# Patient Record
Sex: Female | Born: 1950 | Race: White | Hispanic: No | Marital: Married | Smoking: Never smoker
Health system: Southern US, Community
[De-identification: ages and names within clinical notes are randomized; demographics above are authoritative.]

## PROBLEM LIST (undated history)

## (undated) DIAGNOSIS — K8020 Calculus of gallbladder without cholecystitis without obstruction: Secondary | ICD-10-CM

## (undated) DIAGNOSIS — E079 Disorder of thyroid, unspecified: Secondary | ICD-10-CM

## (undated) DIAGNOSIS — K5790 Diverticulosis of intestine, part unspecified, without perforation or abscess without bleeding: Secondary | ICD-10-CM

## (undated) DIAGNOSIS — N281 Cyst of kidney, acquired: Secondary | ICD-10-CM

## (undated) DIAGNOSIS — I10 Essential (primary) hypertension: Secondary | ICD-10-CM

## (undated) DIAGNOSIS — K449 Diaphragmatic hernia without obstruction or gangrene: Secondary | ICD-10-CM

## (undated) HISTORY — PX: CHOLECYSTECTOMY: SHX55

## (undated) HISTORY — PX: TUBAL LIGATION: SHX77

## (undated) HISTORY — DX: Calculus of gallbladder without cholecystitis without obstruction: K80.20

## (undated) HISTORY — PX: ABDOMINAL HYSTERECTOMY: SHX81

## (undated) HISTORY — DX: Cyst of kidney, acquired: N28.1

---

## 2016-06-05 ENCOUNTER — Encounter (HOSPITAL_COMMUNITY): Payer: Self-pay | Admitting: Cardiology

## 2016-06-05 ENCOUNTER — Emergency Department (HOSPITAL_COMMUNITY): Payer: Medicare Other

## 2016-06-05 ENCOUNTER — Emergency Department (HOSPITAL_COMMUNITY)
Admission: EM | Admit: 2016-06-05 | Discharge: 2016-06-05 | Disposition: A | Discharge status: Home / Self Care | Payer: Medicare Other | Attending: Emergency Medicine | Admitting: Emergency Medicine

## 2016-06-05 DIAGNOSIS — Z79899 Other long term (current) drug therapy: Secondary | ICD-10-CM | POA: Diagnosis not present

## 2016-06-05 DIAGNOSIS — K909 Intestinal malabsorption, unspecified: Secondary | ICD-10-CM | POA: Diagnosis not present

## 2016-06-05 DIAGNOSIS — I10 Essential (primary) hypertension: Secondary | ICD-10-CM | POA: Diagnosis not present

## 2016-06-05 DIAGNOSIS — R1084 Generalized abdominal pain: Secondary | ICD-10-CM

## 2016-06-05 DIAGNOSIS — R197 Diarrhea, unspecified: Secondary | ICD-10-CM | POA: Diagnosis present

## 2016-06-05 HISTORY — DX: Diverticulosis of intestine, part unspecified, without perforation or abscess without bleeding: K57.90

## 2016-06-05 HISTORY — DX: Calculus of gallbladder without cholecystitis without obstruction: K80.20

## 2016-06-05 HISTORY — DX: Disorder of thyroid, unspecified: E07.9

## 2016-06-05 HISTORY — DX: Essential (primary) hypertension: I10

## 2016-06-05 HISTORY — DX: Diaphragmatic hernia without obstruction or gangrene: K44.9

## 2016-06-05 LAB — URINALYSIS, ROUTINE W REFLEX MICROSCOPIC
Bilirubin Urine: NEGATIVE
GLUCOSE, UA: NEGATIVE mg/dL
HGB URINE DIPSTICK: NEGATIVE
KETONES UR: NEGATIVE mg/dL
Nitrite: NEGATIVE
PROTEIN: NEGATIVE mg/dL
Specific Gravity, Urine: 1.021 (ref 1.005–1.030)
pH: 5 (ref 5.0–8.0)

## 2016-06-05 LAB — COMPREHENSIVE METABOLIC PANEL
ALK PHOS: 77 U/L (ref 38–126)
ALT: 17 U/L (ref 14–54)
ANION GAP: 9 (ref 5–15)
AST: 21 U/L (ref 15–41)
Albumin: 4.1 g/dL (ref 3.5–5.0)
BUN: 10 mg/dL (ref 6–20)
CALCIUM: 9.5 mg/dL (ref 8.9–10.3)
CHLORIDE: 104 mmol/L (ref 101–111)
CO2: 25 mmol/L (ref 22–32)
Creatinine, Ser: 1.01 mg/dL — ABNORMAL HIGH (ref 0.44–1.00)
GFR calc non Af Amer: 57 mL/min — ABNORMAL LOW (ref >60)
Glucose, Bld: 114 mg/dL — ABNORMAL HIGH (ref 65–99)
POTASSIUM: 4.1 mmol/L (ref 3.5–5.1)
SODIUM: 138 mmol/L (ref 135–145)
Total Bilirubin: 0.8 mg/dL (ref 0.3–1.2)
Total Protein: 7.6 g/dL (ref 6.5–8.1)

## 2016-06-05 LAB — CBC
HCT: 41.9 % (ref 36.0–46.0)
HEMOGLOBIN: 14.2 g/dL (ref 12.0–15.0)
MCH: 32.4 pg (ref 26.0–34.0)
MCHC: 33.9 g/dL (ref 30.0–36.0)
MCV: 95.7 fL (ref 78.0–100.0)
Platelets: 291 10*3/uL (ref 150–400)
RBC: 4.38 MIL/uL (ref 3.87–5.11)
RDW: 14 % (ref 11.5–15.5)
WBC: 10.5 10*3/uL (ref 4.0–10.5)

## 2016-06-05 LAB — LIPASE, BLOOD: LIPASE: 20 U/L (ref 11–51)

## 2016-06-05 MED ORDER — DICYCLOMINE HCL 20 MG PO TABS: 20.0000 mg | ORAL_TABLET | Freq: Two times a day (BID) | ORAL | 0 refills | Status: DC

## 2016-06-05 MED ORDER — ONDANSETRON 4 MG PO TBDP: 4.0000 mg | ORAL_TABLET | Freq: Three times a day (TID) | ORAL | 0 refills | Status: DC | PRN

## 2016-06-05 MED ORDER — KETOROLAC TROMETHAMINE 30 MG/ML IJ SOLN
30.0000 mg | Freq: Once | INTRAMUSCULAR | Status: AC
  Administered 2016-06-05: 30 mg via INTRAVENOUS
  Filled 2016-06-05: qty 1

## 2016-06-05 NOTE — Discharge Instructions (Signed)
Dicyclomine 4 times daily as needed Zofran for nausea Your testing shows that you have a kidney spot that needs further evaluation with an MRI Share these results with your doctor and have them order the further testing for the next 2 weeks ER for severe or worsening symptoms.

## 2016-06-05 NOTE — ED Provider Notes (Signed)
[facility]Y DEPT Provider Note   CSN: [id number] Arrival date & time: 06/05/16  1028  By signing my name below, I, Denise Mcguire, attest that this documentation has been prepared under the direction and in the presence of Eber Hong, MD. Electronically Signed: Marnette Burgess Mcguire, Scribe. 06/05/2016. 12:02 PM.  History   Chief Complaint Chief Complaint  Patient presents with  . Abdominal Pain  . Emesis   The history is provided by the patient and medical records. No language interpreter was used.   HPI Comments:  Denise Mcguire is a 66 y.o. female with a PMHx of Cholelithiasis, HTN, Hiatal Hernia, Thyroid Disease, and Diverticulosis, who presents to the Emergency Department complaining of intermittent, radiating, 8/10, upper abdominal pain onset two weeks ago. Pt reports these episodes of upper abdominal pain arising two weeks ago being persistent since that time. She states the pain is intermittent and radiates down into her suprapubic abdomen. When present, the pain remains for 10 minutes before subsiding. Yesterday PTA, the pt had associated symptoms of vomiting and diarrhea x4 qualified as watery. No h/o COPD, Asthma, UC, IBS, CA, Pancreatitis, or any significant coronary events. No recent travel or sick contact with similar symptoms. No alleviating factors noted and eating does not exacerbate her pain. Pt has a PSHx of hysterectomy for bladder drop. Pt denies urgency, frequency, hematuria, dysuria, difficulty urinating, back pain, any current pain, alongside any other complaints at this time.  Past Medical History:  Diagnosis Date  . Diverticulosis   . Gallstones   . Hiatal hernia   . Hypertension   . Thyroid disease    There are no active problems to display for this patient.  Past Surgical History:  Procedure Laterality Date  . ABDOMINAL HYSTERECTOMY     OB History    No data available     Home Medications    Prior to Admission medications   Medication Sig Start Date  End Date Taking? Authorizing Provider  ALPRAZolam Prudy Feeler) 1 MG tablet Take 1 tablet by mouth 3 (three) times daily as needed for anxiety. 05/29/16  Yes [provider]  HYDROcodone-acetaminophen (NORCO/VICODIN) 5-325 MG tablet Take 1 tablet by mouth 3 (three) times daily as needed for moderate pain.   Yes [provider]  pantoprazole (PROTONIX) 40 MG tablet Take 1 tablet by mouth daily. 05/12/16  Yes [provider]  PARoxetine (PAXIL) 10 MG tablet Take 1 tablet by mouth daily. 05/12/16  Yes [provider]  propranolol (INDERAL) 10 MG tablet Take 1 tablet by mouth 2 (two) times daily. 05/12/16  Yes [provider]  ranitidine (ZANTAC) 150 MG tablet Take 1 tablet by mouth daily. 05/29/16  Yes [provider]  SYNTHROID 75 MCG tablet Take 1 tablet by mouth daily. 05/12/16  Yes [provider]  traZODone (DESYREL) 50 MG tablet Take 50-100 mg by mouth daily as needed for sleep. 05/29/16  Yes [provider]  dicyclomine (BENTYL) 20 MG tablet Take 1 tablet (20 mg total) by mouth 2 (two) times daily. 06/05/16   Eber Hong, MD  ondansetron (ZOFRAN ODT) 4 MG disintegrating tablet Take 1 tablet (4 mg total) by mouth every 8 (eight) hours as needed for nausea. 06/05/16   Eber Hong, MD    Family History History reviewed. No pertinent family history.  Social History Social History  Substance Use Topics  . Smoking status: Never Smoker  . Smokeless tobacco: Never Used  . Alcohol use Yes     Comment: occasional  Allergies   Patient has no known allergies.   Review of Systems Review of Systems  Gastrointestinal: Positive for abdominal pain, diarrhea and vomiting.  Genitourinary: Negative for difficulty urinating, dysuria, frequency, hematuria and urgency.  Musculoskeletal: Negative for back pain and myalgias.  All other systems reviewed and are negative.  Physical Exam Updated Vital Signs BP 125/82   Pulse 83   Temp  98.3 F (36.8 C) (Oral)   Resp 16   Ht 5\' 5"  (1.651 m)   Wt 170 lb (77.1 kg)   SpO2 98%   BMI 28.29 kg/m   Physical Exam  Constitutional: She is oriented to person, place, and time. She appears well-developed and well-nourished.  HENT:  Head: Normocephalic.  Eyes: Conjunctivae are normal.  Cardiovascular: Normal rate.   Pulmonary/Chest: Effort normal.  Abdominal: She exhibits no distension. There is tenderness (mild) in the right upper quadrant and epigastric area.  Musculoskeletal: Normal range of motion.  Neurological: She is alert and oriented to person, place, and time.  Skin: Skin is warm and dry.  Psychiatric: She has a normal mood and affect.  Nursing note and vitals reviewed.   ED Treatments / Results  DIAGNOSTIC STUDIES:  Oxygen Saturation is 98% on RA, normal by my interpretation.    COORDINATION OF CARE:  11:12 AM Discussed treatment plan with pt at bedside including blood work, UA, US and pt agreed to plan.   Labs (all labs ordered are listed, but only abnormal results are displayed) Labs Reviewed  COMPREHENSIVE METABOLIC PANEL - Abnormal; Notable for the following:       Result Value   Glucose, Bld 114 (*)    Creatinine, Ser 1.01 (*)    GFR calc non Af Amer 57 (*)    All other components within normal limits  URINALYSIS, ROUTINE W REFLEX MICROSCOPIC - Abnormal; Notable for the following:    APPearance HAZY (*)    Leukocytes, UA SMALL (*)    Bacteria, UA RARE (*)    Squamous Epithelial / LPF 0-5 (*)    All other components within normal limits  LIPASE, BLOOD  CBC    EKG  EKG Interpretation None       Radiology [place] Abdomen Complete  Result Date: 06/05/2016 CLINICAL DATA:  Upper abdominal pain EXAM: ABDOMEN ULTRASOUND COMPLETE COMPARISON:  None. FINDINGS: Gallbladder: The sonographer identifies an 18 mm non mobile structure in the gallbladder lumen. Structure demonstrates posterior shadowing. No gallbladder wall thickening. The sonographer  reports no sonographic Murphy sign. Common bile duct: Diameter: 3 mm Liver: No focal lesion identified. Within normal limits in parenchymal echogenicity. IVC: No abnormality visualized. Pancreas: Visualized portion unremarkable. Spleen: Size and appearance within normal limits. Right Kidney: Length: 10.1 cm. 2.3 cm hypoechoic lesion identified in the lower pole. No hydronephrosis Left Kidney: Length: 10.6 cm. Echogenicity within normal limits. No mass or hydronephrosis visualized. Abdominal aorta: No aneurysm visualized. Other findings: None. IMPRESSION: 1. 18 mm non mobile structure in the gallbladder lumen likely a stone although lack of mobility raises the question of a polypoid soft tissue lesion. 2. 2.3 cm hypoechoic lesion lower pole right kidney. MRI without with contrast recommended to exclude soft tissue neoplasm. Gallbladder finding could also be assessed better at the time of MRI. Electronically Signed   By: Kennith CenterEric  Mansell M.D.   On: 06/05/2016 13:20    Procedures Procedures (including critical care time)  US negative Labs reassuring UA clear Pt given all results including renal spot and GB polyp vs stone Needs  outpt f/u No surgical abdomen Pt expressed understanding.  Medications Ordered in ED Medications  ketorolac (TORADOL) 30 MG/ML injection 30 mg (30 mg Intravenous Given 06/05/16 1134)     Initial Impression / Assessment and Plan / ED Course  I have reviewed the triage vital signs and the nursing notes.  Pertinent labs & imaging results that were available during my care of the patient were reviewed by me and considered in my medical decision making (see chart for details).     Final Clinical Impressions(s) / ED Diagnoses   Final diagnoses:  Generalized abdominal pain  Diarrhea due to malabsorption    New Prescriptions New Prescriptions   DICYCLOMINE (BENTYL) 20 MG TABLET    Take 1 tablet (20 mg total) by mouth 2 (two) times daily.   ONDANSETRON (ZOFRAN ODT) 4 MG  DISINTEGRATING TABLET    Take 1 tablet (4 mg total) by mouth every 8 (eight) hours as needed for nausea.    I personally performed the services described in this documentation, which was scribed in my presence. The recorded information has been reviewed and is accurate.        Eber Hong, MD 06/05/16 [phone]

## 2016-06-05 NOTE — ED Triage Notes (Signed)
Upper abdominal pain and vomiting times 2 weeks

## 2016-06-16 ENCOUNTER — Emergency Department (HOSPITAL_COMMUNITY)
Admission: EM | Admit: 2016-06-16 | Discharge: 2016-06-16 | Disposition: A | Discharge status: Home / Self Care | Payer: Medicare Other | Attending: Emergency Medicine | Admitting: Emergency Medicine

## 2016-06-16 ENCOUNTER — Emergency Department (HOSPITAL_COMMUNITY): Payer: Medicare Other

## 2016-06-16 ENCOUNTER — Encounter (HOSPITAL_COMMUNITY): Payer: Self-pay | Admitting: Emergency Medicine

## 2016-06-16 DIAGNOSIS — R1013 Epigastric pain: Secondary | ICD-10-CM | POA: Diagnosis not present

## 2016-06-16 DIAGNOSIS — K625 Hemorrhage of anus and rectum: Secondary | ICD-10-CM | POA: Insufficient documentation

## 2016-06-16 DIAGNOSIS — I10 Essential (primary) hypertension: Secondary | ICD-10-CM | POA: Insufficient documentation

## 2016-06-16 DIAGNOSIS — Z79899 Other long term (current) drug therapy: Secondary | ICD-10-CM | POA: Diagnosis not present

## 2016-06-16 DIAGNOSIS — R911 Solitary pulmonary nodule: Secondary | ICD-10-CM | POA: Diagnosis not present

## 2016-06-16 DIAGNOSIS — N281 Cyst of kidney, acquired: Secondary | ICD-10-CM | POA: Insufficient documentation

## 2016-06-16 DIAGNOSIS — R109 Unspecified abdominal pain: Secondary | ICD-10-CM | POA: Diagnosis present

## 2016-06-16 LAB — CBC
HCT: 40.2 % (ref 36.0–46.0)
Hemoglobin: 13.8 g/dL (ref 12.0–15.0)
MCH: 32.5 pg (ref 26.0–34.0)
MCHC: 34.3 g/dL (ref 30.0–36.0)
MCV: 94.8 fL (ref 78.0–100.0)
Platelets: 266 K/uL (ref 150–400)
RBC: 4.24 MIL/uL (ref 3.87–5.11)
RDW: 13.4 % (ref 11.5–15.5)
WBC: 8.9 K/uL (ref 4.0–10.5)

## 2016-06-16 LAB — COMPREHENSIVE METABOLIC PANEL
ALK PHOS: 70 U/L (ref 38–126)
ALT: 16 U/L (ref 14–54)
ANION GAP: 8 (ref 5–15)
AST: 20 U/L (ref 15–41)
Albumin: 4.2 g/dL (ref 3.5–5.0)
BILIRUBIN TOTAL: 0.6 mg/dL (ref 0.3–1.2)
BUN: 13 mg/dL (ref 6–20)
CALCIUM: 9 mg/dL (ref 8.9–10.3)
CO2: 23 mmol/L (ref 22–32)
Chloride: 106 mmol/L (ref 101–111)
Creatinine, Ser: 0.94 mg/dL (ref 0.44–1.00)
GLUCOSE: 107 mg/dL — AB (ref 65–99)
Potassium: 3.8 mmol/L (ref 3.5–5.1)
Sodium: 137 mmol/L (ref 135–145)
TOTAL PROTEIN: 7.5 g/dL (ref 6.5–8.1)

## 2016-06-16 LAB — TYPE AND SCREEN
ABO/RH(D): O POS
ANTIBODY SCREEN: NEGATIVE

## 2016-06-16 LAB — POC OCCULT BLOOD, ED: Fecal Occult Bld: NEGATIVE

## 2016-06-16 MED ORDER — IOPAMIDOL (ISOVUE-300) INJECTION 61%
100.0000 mL | Freq: Once | INTRAVENOUS | Status: AC | PRN
  Administered 2016-06-16: 100 mL via INTRAVENOUS

## 2016-06-16 NOTE — ED Notes (Signed)
Pt ambulated to BR with assistance.

## 2016-06-16 NOTE — Discharge Instructions (Addendum)
If you were given medicines take as directed.  If you are on coumadin or contraceptives realize their levels and effectiveness is altered by many different medicines.  If you have any reaction (rash, tongues swelling, other) to the medicines stop taking and see a physician.    Repeat CT chest in 6 months.  Follow up results of your MRI.   If your blood pressure was elevated in the ER make sure you follow up for management with a primary doctor or return for chest pain, shortness of breath or stroke symptoms.  Please follow up as directed and return to the ER or see a physician for new or worsening symptoms.  Thank you. Vitals:   06/16/16 1106  BP: 125/83  Pulse: 88  Resp: 14  Temp: 97.6 F (36.4 C)  TempSrc: Oral  SpO2: 97%  Weight: 170 lb (77.1 kg)  Height: 5\' 5"  (1.651 m)

## 2016-06-16 NOTE — ED Provider Notes (Signed)
[facility] Provider Note   CSN: [id number] Arrival date & time: 06/16/16  1100  By signing my name below, I, Bing Neighbors., attest that this documentation has been prepared under the direction and in the presence of Blane Ohara, MD. Electronically signed: Bing Neighbors., ED Scribe. 06/16/16. 3:08 PM.   History   Chief Complaint Chief Complaint  Patient presents with  . Rectal Bleeding    HPI Denise Mcguire is a 66 y.o. female with hx of diverticulosis who presents to the Emergency Department complaining of abdominal pain with onset x5 hours. Pt reportedly awoke x5 hours ago with abdominal pain that she describes as pressure. Pt reports noticing dark, tarry stool with her last BM. Pt reports decreased appetite, increased gas. She denies any modifying factors. Pt denies weight loss. Pt reports alcohol use. Pt denies hx of colon cancer, family hx of colon cancer. She denies taking iron supplements.  Of note, Pt states that she was seen x11 days ago for abdominal pain and upon ultrasound there was an area of concern found on her kidneys. She had an MRI done x3 days ago but has not received the results. Pt states that her last colonoscopy was x2 years ago which was normal.    The history is provided by the patient and the spouse. No language interpreter was used.    Past Medical History:  Diagnosis Date  . Diverticulosis   . Gallstones   . Hiatal hernia   . Hypertension   . Thyroid disease     There are no active problems to display for this patient.   Past Surgical History:  Procedure Laterality Date  . ABDOMINAL HYSTERECTOMY    . TUBAL LIGATION      OB History    No data available       Home Medications    Prior to Admission medications   Medication Sig Start Date End Date Taking? Authorizing Provider  ALPRAZolam Prudy Feeler) 1 MG tablet Take 1 tablet by mouth 3 (three) times daily as needed for anxiety. 05/29/16  Yes [provider]  dicyclomine (BENTYL) 20 MG tablet Take 1 tablet (20 mg total) by mouth 2 (two) times daily. 06/05/16  Yes Eber Hong, MD  HYDROcodone-acetaminophen (NORCO/VICODIN) 5-325 MG tablet Take 1 tablet by mouth 3 (three) times daily as needed for moderate pain.   Yes [provider]  ondansetron (ZOFRAN ODT) 4 MG disintegrating tablet Take 1 tablet (4 mg total) by mouth every 8 (eight) hours as needed for nausea. 06/05/16  Yes Eber Hong, MD  pantoprazole (PROTONIX) 40 MG tablet Take 1 tablet by mouth daily. 05/12/16  Yes [provider]  PARoxetine (PAXIL) 10 MG tablet Take 1 tablet by mouth daily. 05/12/16  Yes [provider]  propranolol (INDERAL) 10 MG tablet Take 1 tablet by mouth 2 (two) times daily. 05/12/16  Yes [provider]  ranitidine (ZANTAC) 150 MG tablet Take 1 tablet by mouth daily. 05/29/16  Yes [provider]  SYNTHROID 75 MCG tablet Take 1 tablet by mouth daily. 05/12/16  Yes [provider]  traZODone (DESYREL) 50 MG tablet Take 50-100 mg by mouth daily as needed for sleep. 05/29/16  Yes [provider]    Family History History reviewed. No pertinent family history.  Social History Social History  Substance Use Topics  . Smoking status: Never Smoker  . Smokeless tobacco: Never Used  . Alcohol use Yes     Comment: occasional  Allergies   Amitriptyline   Review of Systems Review of Systems  Constitutional: Positive for appetite change. Negative for unexpected weight change.  Gastrointestinal: Positive for abdominal pain and blood in stool. Negative for vomiting.  All other systems reviewed and are negative.    Physical Exam Updated Vital Signs BP 102/73   Pulse 75   Temp 97.6 F (36.4 C) (Oral)   Resp 14   Ht 5\' 5"  (1.651 m)   Wt 170 lb (77.1 kg)   SpO2 97%   BMI 28.29 kg/m   Physical Exam  Constitutional: She appears well-developed and well-nourished. No distress.  HENT:  Head:  Normocephalic and atraumatic.  Eyes: Conjunctivae are normal.  Conjunctiva is normal appearing.   Neck: Neck supple.  Cardiovascular: Normal rate and regular rhythm.   No murmur heard. Pulmonary/Chest: Effort normal and breath sounds normal. No respiratory distress.  Abdominal: Soft. There is no tenderness.  Genitourinary:  Genitourinary Comments: Dark stools, hemocult neg  Musculoskeletal: She exhibits no edema.  Neurological: She is alert.  Skin: Skin is warm and dry.  Psychiatric: She has a normal mood and affect.  Nursing note and vitals reviewed.    ED Treatments / Results   DIAGNOSTIC STUDIES: Oxygen Saturation is 97% on RA, adequate by my interpretation.   COORDINATION OF CARE: 3:08 PM-Discussed next steps with pt. Pt verbalized understanding and is agreeable with the plan.    Labs (all labs ordered are listed, but only abnormal results are displayed) Labs Reviewed  COMPREHENSIVE METABOLIC PANEL - Abnormal; Notable for the following:       Result Value   Glucose, Bld 107 (*)    All other components within normal limits  CBC  POC OCCULT BLOOD, ED  TYPE AND SCREEN    EKG  EKG Interpretation  Date/Time:  Friday Jun 16 2016 11:23:27 EDT Ventricular Rate:  76 PR Interval:    QRS Duration: 104 QT Interval:  383 QTC Calculation: 431 R Axis:   -8 Text Interpretation:  Sinus rhythm Abnormal R-wave progression, early transition Baseline wander in lead(s) III aVF Confirmed by Issachar Broady MD, Sammantha Mehlhaff [phone]([id number]) on 06/16/2016 12:23:51 PM       Radiology Ct Abdomen Pelvis W Contrast  Result Date: 06/16/2016 CLINICAL DATA:  Pelvic pain and rectal bleeding. EXAM: CT ABDOMEN AND PELVIS WITH CONTRAST TECHNIQUE: Multidetector CT imaging of the abdomen and pelvis was performed using the standard protocol following bolus administration of intravenous contrast. CONTRAST:  100mL ISOVUE-300 IOPAMIDOL (ISOVUE-300) INJECTION 61% COMPARISON:  Abdominal ultrasound Jun 05, 2016 FINDINGS:  Lower chest: There is scarring in the lung bases bilaterally. On axial slice 2 series 5, there is a nodular opacity measuring 6 x 3 mm in the medial segment of the right middle lobe. There is a focal hiatal hernia. Hepatobiliary: No focal liver lesions are evident. Gallbladder wall is not appreciably thickened. The lesions seen in the neck of the gallbladder on recent ultrasound is not appreciable by CT. There is no appreciable biliary duct dilatation. Pancreas: There is no pancreatic mass or inflammatory focus. Spleen: No splenic lesions are evident. Adrenals/Urinary Tract: Adrenals appear unremarkable bilaterally. Kidneys bilaterally show no evident hydronephrosis. There is a cyst arising from the lower pole of the right kidney measuring 2.0 x 2.0 cm. There is a second mass on the right measuring 1 x 1 cm. This lesion has attenuation values higher than expected with a simple cyst. There is no renal or ureteral calculus on either side. Urinary bladder is midline with  wall thickness within normal limits. Stomach/Bowel: There are scattered sigmoid diverticula without diverticulitis. There are areas of mild wall thickening in the sigmoid colon, potentially representing muscular hypertrophy due to chronic diverticulosis. There is no appreciable mesenteric thickening. There is no appreciable bowel wall thickening elsewhere. Note that there is stool throughout much of the colon. There is no bowel obstruction. No free air or portal venous air. Vascular/Lymphatic: There is atherosclerotic calcification in the aorta and common iliac arteries without evidence of aneurysm. Major mesenteric vessels appear patent. There is no appreciable adenopathy in the abdomen or pelvis. Reproductive: Uterus is absent.  No evident pelvic mass. Other: Appendix appears unremarkable. There is no ascites or abscess in the abdomen or pelvis. There is a rather minimal ventral hernia containing only fat. There is fat in each inguinal ring.  Musculoskeletal: There is degenerative change in the lumbar spine. There are no blastic or lytic bone lesions. There is no intramuscular or abdominal wall lesion. IMPRESSION: Sigmoid diverticula without diverticulitis. Slight generalized sigmoid wall thickening may be due to chronic diverticulosis with muscular hypertrophy. Given history of rectal bleeding, it may be prudent to consider direct visualization after colon preparation to exclude the possibility of a mass lesion. Note that stool throughout much of the colon could easily obscure a colonic lesion. No bowel obstruction.  No abscess.  Appendix appears normal. No renal or ureteral calculus.  No hydronephrosis. No gallbladder wall thickening. The lesion seen in the neck of the gallbladder is not evident by CT. There is a cyst in the lower pole right kidney. A second lesion in the lower pole the right kidney has attenuation values higher than is expected with a simple cyst. Given this circumstance, advise nonemergent pre and post-contrast MR of the kidneys to further assess. There is aortoiliac atherosclerosis. Uterus absent. Focal hiatal hernia.  Minimal ventral hernia containing only fat. 6 x 3 mm nodular opacity right middle lobe. No follow-up needed if patient is low-risk. Non-contrast chest CT can be considered in 12 months if patient is high-risk. This recommendation follows the consensus statement: Guidelines for Management of Incidental Pulmonary Nodules Detected on CT Images: From the Fleischner Society 2017; Radiology 2017; 284:228-243. Electronically Signed   By: Bretta Bang III M.D.   On: 06/16/2016 14:57    Procedures Procedures (including critical care time)  Medications Ordered in ED Medications  iopamidol (ISOVUE-300) 61 % injection 100 mL (100 mLs Intravenous Contrast Given 06/16/16 1404)     Initial Impression / Assessment and Plan / ED Course  I have reviewed the triage vital signs and the nursing notes.  Pertinent labs &  imaging results that were available during my care of the patient were reviewed by me and considered in my medical decision making (see chart for details).    Patient presents with melena with no history of similar. No focal abdominal pain however patient has had mild discomfort. CT scan revealed a number of nonemergent findings however they all need close follow-up outpatient. Patient may have bleeding from diverticula versus colon mass versus other. Patient the close [facility] neurology follow-up next week. Patient also has a pulmonology nodule that needs repeat CT scan within 6 months. Patient had an MRI for her renal cyst. No bleeding in ED.   Results and differential diagnosis were discussed with the patient/parent/guardian. Xrays were independently reviewed by myself.  Close follow up outpatient was discussed, comfortable with the plan.   Medications  iopamidol (ISOVUE-300) 61 % injection 100 mL (100 mLs Intravenous  Contrast Given 06/16/16 1404)    Vitals:   06/16/16 1300 06/16/16 1330 06/16/16 1430 06/16/16 1500  BP: 122/77 120/87 114/75 102/73  Pulse: 75 75 73 75  Resp: 13 14 (!) 9 14  Temp:      TempSrc:      SpO2: 96% 98% 97% 97%  Weight:      Height:        Final diagnoses:  Rectal bleeding  Epigastric pain  Pulmonary nodule  Renal cyst     Final Clinical Impressions(s) / ED Diagnoses   Final diagnoses:  Rectal bleeding  Epigastric pain  Pulmonary nodule  Renal cyst    New Prescriptions New Prescriptions   No medications on file       Blane Ohara, MD 06/16/16 [phone]

## 2016-06-16 NOTE — ED Triage Notes (Signed)
Patient complaining of abdominal pain and black, tarry stools this morning with bowel movement.

## 2016-06-16 NOTE — ED Notes (Signed)
Patient transported to CT 

## 2016-10-19 ENCOUNTER — Encounter (HOSPITAL_COMMUNITY): Payer: Self-pay | Admitting: *Deleted

## 2016-10-19 ENCOUNTER — Emergency Department (HOSPITAL_COMMUNITY)
Admission: EM | Admit: 2016-10-19 | Discharge: 2016-10-19 | Disposition: A | Discharge status: Home / Self Care | Payer: Medicare Other | Attending: Emergency Medicine | Admitting: Emergency Medicine

## 2016-10-19 DIAGNOSIS — B373 Candidiasis of vulva and vagina: Secondary | ICD-10-CM | POA: Diagnosis not present

## 2016-10-19 DIAGNOSIS — R0981 Nasal congestion: Secondary | ICD-10-CM | POA: Diagnosis present

## 2016-10-19 DIAGNOSIS — I10 Essential (primary) hypertension: Secondary | ICD-10-CM | POA: Insufficient documentation

## 2016-10-19 DIAGNOSIS — E039 Hypothyroidism, unspecified: Secondary | ICD-10-CM

## 2016-10-19 DIAGNOSIS — R635 Abnormal weight gain: Secondary | ICD-10-CM | POA: Diagnosis not present

## 2016-10-19 DIAGNOSIS — Z79899 Other long term (current) drug therapy: Secondary | ICD-10-CM | POA: Diagnosis not present

## 2016-10-19 DIAGNOSIS — B3731 Acute candidiasis of vulva and vagina: Secondary | ICD-10-CM

## 2016-10-19 DIAGNOSIS — J019 Acute sinusitis, unspecified: Secondary | ICD-10-CM | POA: Insufficient documentation

## 2016-10-19 LAB — URINALYSIS, ROUTINE W REFLEX MICROSCOPIC
Bilirubin Urine: NEGATIVE
GLUCOSE, UA: NEGATIVE mg/dL
HGB URINE DIPSTICK: NEGATIVE
KETONES UR: NEGATIVE mg/dL
LEUKOCYTES UA: NEGATIVE
Nitrite: NEGATIVE
PH: 6 (ref 5.0–8.0)
Protein, ur: NEGATIVE mg/dL
Specific Gravity, Urine: 1.013 (ref 1.005–1.030)

## 2016-10-19 LAB — CBC WITH DIFFERENTIAL/PLATELET
Basophils Absolute: 0 10*3/uL (ref 0.0–0.1)
Basophils Relative: 1 %
EOS ABS: 0.1 10*3/uL (ref 0.0–0.7)
EOS PCT: 2 %
HCT: 42 % (ref 36.0–46.0)
Hemoglobin: 14.1 g/dL (ref 12.0–15.0)
LYMPHS ABS: 1.6 10*3/uL (ref 0.7–4.0)
LYMPHS PCT: 18 %
MCH: 32.2 pg (ref 26.0–34.0)
MCHC: 33.6 g/dL (ref 30.0–36.0)
MCV: 95.9 fL (ref 78.0–100.0)
MONO ABS: 0.5 10*3/uL (ref 0.1–1.0)
MONOS PCT: 6 %
Neutro Abs: 6.5 10*3/uL (ref 1.7–7.7)
Neutrophils Relative %: 73 %
PLATELETS: 261 10*3/uL (ref 150–400)
RBC: 4.38 MIL/uL (ref 3.87–5.11)
RDW: 13.9 % (ref 11.5–15.5)
WBC: 8.8 10*3/uL (ref 4.0–10.5)

## 2016-10-19 LAB — BASIC METABOLIC PANEL
Anion gap: 11 (ref 5–15)
BUN: 8 mg/dL (ref 6–20)
CHLORIDE: 106 mmol/L (ref 101–111)
CO2: 26 mmol/L (ref 22–32)
CREATININE: 0.93 mg/dL (ref 0.44–1.00)
Calcium: 9.7 mg/dL (ref 8.9–10.3)
GFR calc Af Amer: >60 mL/min (ref >60)
GFR calc non Af Amer: >60 mL/min (ref >60)
GLUCOSE: 116 mg/dL — AB (ref 65–99)
POTASSIUM: 4 mmol/L (ref 3.5–5.1)
SODIUM: 143 mmol/L (ref 135–145)

## 2016-10-19 LAB — TSH: TSH: 2.17 u[IU]/mL (ref 0.350–4.500)

## 2016-10-19 MED ORDER — FLUCONAZOLE 200 MG PO TABS: 200.0000 mg | ORAL_TABLET | Freq: Every day | ORAL | 0 refills | Status: AC

## 2016-10-19 NOTE — Discharge Instructions (Signed)
Your lab tests today are normal and reassuring.  Your TSH level (thyroid level) is still in progress at this time.  You will need to followup with a primary doctor for adjustment in your thyroid medicine if this test is abnormal as discussed. You are being prescribed a medicine for your probable yeast infection.  You do not have a urinary infection, I suspect your urinary symptoms are related to the yeast infection.

## 2016-10-19 NOTE — ED Provider Notes (Signed)
[facility]-EMER[facility]ENCY DEPT Provider Note   CSN: [id number] Arrival date & time: 10/19/16  1334     History   Chief Complaint Chief Complaint  Patient presents with  . URI    HPI Denise Mcguire is a 66 y.o. female with a Past medical history of hypertension, hypothyroidism treated with Synthroid, diverticulosis and history of hiatal hernia presenting with multiple complaints.  She described developing nasal congestion, sinus pressure along with postnasal drip sore throat and generalized fatigue for the past 4 days.  She was seen at a clinic in [place] 2 days ago and was diagnosed with a sinus infection.  She started taking her medications including Augmentin (has had 2 doses to date), along with Phenergan cough syrup but does not feel improved.  She has multiple other complaints including abdominal distention and weight gain despite poor appetite over the past year. She is afraid she is "retaining fluid in her abdomen and if it moves to her heart, it will be dangerous".  Additionally she describes diarrhea started several hours after taking her first Augmentin tablet 2 days ago, stating had 4 nonbloody stools in the past 24 hours. However does have chronic intermittent problems with diarrhea not dissimilar to today's symptoms.  She also endorses dysuria which started today and vaginal itching which she suspects is the typical yeast infection she gets with antibiotic use.  She does not recall last time she had her thyroid levels checked, she has had no recent changes in her thyroid medication but does endorse generalized fatigue.   She denies headache, chest pain, cough or shortness of breath.  She also denies abdominal pain.    Her primary provider retired and she is currently seeking a new medical home.    The history is provided by the patient and the spouse.    Past Medical History:  Diagnosis Date  . Diverticulosis   . Gallstones   . Hiatal hernia   . Hypertension   . Thyroid disease      There are no active problems to display for this patient.   Past Surgical History:  Procedure Laterality Date  . ABDOMINAL HYSTERECTOMY    . TUBAL LIGATION      OB History    No data available       Home Medications    Prior to Admission medications   Medication Sig Start Date End Date Taking? Authorizing Provider  ALPRAZolam Prudy Feeler) 1 MG tablet Take 1 tablet by mouth 3 (three) times daily as needed for anxiety. 05/29/16   [provider]  dicyclomine (BENTYL) 20 MG tablet Take 1 tablet (20 mg total) by mouth 2 (two) times daily. 06/05/16   Eber Hong, MD  fluconazole (DIFLUCAN) 200 MG tablet Take 1 tablet (200 mg total) by mouth daily. 10/19/16 10/26/16  Burgess Amor, PA-C  HYDROcodone-acetaminophen (NORCO/VICODIN) 5-325 MG tablet Take 1 tablet by mouth 3 (three) times daily as needed for moderate pain.    [provider]  ondansetron (ZOFRAN ODT) 4 MG disintegrating tablet Take 1 tablet (4 mg total) by mouth every 8 (eight) hours as needed for nausea. 06/05/16   Eber Hong, MD  pantoprazole (PROTONIX) 40 MG tablet Take 1 tablet by mouth daily. 05/12/16   [provider]  PARoxetine (PAXIL) 10 MG tablet Take 1 tablet by mouth daily. 05/12/16   [provider]  propranolol (INDERAL) 10 MG tablet Take 1 tablet by mouth 2 (two) times daily. 05/12/16   [provider]  ranitidine (ZANTAC) 150  MG tablet Take 1 tablet by mouth daily. 05/29/16   [provider]  SYNTHROID 75 MCG tablet Take 1 tablet by mouth daily. 05/12/16   [provider]  traZODone (DESYREL) 50 MG tablet Take 50-100 mg by mouth daily as needed for sleep. 05/29/16   [provider]    Family History No family history on file.  Social History Social History  Substance Use Topics  . Smoking status: Never Smoker  . Smokeless tobacco: Never Used  . Alcohol use Yes     Comment: occasional      Allergies   Amitriptyline and  Prednisone   Review of Systems Review of Systems  Constitutional: Positive for appetite change. Negative for fever.  HENT: Positive for congestion, sinus pressure and sore throat. Negative for postnasal drip, rhinorrhea and sinus pain.   Eyes: Negative.   Respiratory: Negative for chest tightness and shortness of breath.   Cardiovascular: Negative for chest pain.  Gastrointestinal: Positive for abdominal distention and diarrhea. Negative for abdominal pain, nausea and vomiting.  Genitourinary: Positive for dysuria. Negative for vaginal discharge.  Musculoskeletal: Negative for arthralgias, joint swelling and neck pain.  Skin: Negative.  Negative for rash and wound.  Neurological: Negative for dizziness, weakness, light-headedness, numbness and headaches.  Psychiatric/Behavioral: Negative.      Physical Exam Updated Vital Signs BP 140/90 (BP Location: Right Arm)   Pulse 73   Temp 98.2 F (36.8 C) (Oral)   Resp 18   Ht  (1.651 m)   Wt 77.1 kg (170 lb)   SpO2 96%   BMI 28.29 kg/m   Physical Exam  Constitutional: She appears well-developed and well-nourished.  HENT:  Head: Normocephalic and atraumatic.  Right Ear: Tympanic membrane and ear canal normal.  Left Ear: Tympanic membrane and ear canal normal.  Nose: Mucosal edema present. No rhinorrhea.  Mouth/Throat: Uvula is midline, oropharynx is clear and moist and mucous membranes are normal. No oropharyngeal exudate, posterior oropharyngeal edema or posterior oropharyngeal erythema. Tonsils are 0 on the right. Tonsils are 0 on the left. No tonsillar exudate.  Mild hoarseness of voice.  Eyes: Conjunctivae are normal.  Neck: Normal range of motion.  Cardiovascular: Normal rate, regular rhythm, normal heart sounds and intact distal pulses.   Pulmonary/Chest: Effort normal and breath sounds normal. She has no wheezes.  Abdominal: Soft. Bowel sounds are normal. She exhibits no abdominal bruit, no ascites, no pulsatile midline  mass and no mass. There is no hepatosplenomegaly. There is no tenderness. There is no guarding.  Abdominal moderate central adiposity. No ascites appreciated.  Benign abdominal exam.  Musculoskeletal: Normal range of motion. She exhibits no edema.  Neurological: She is alert.  Skin: Skin is warm and dry.  Psychiatric: She has a normal mood and affect.  Nursing note and vitals reviewed.    ED Treatments / Results  Labs (all labs ordered are listed, but only abnormal results are displayed) Labs Reviewed  BASIC METABOLIC PANEL - Abnormal; Notable for the following:       Result Value   Glucose, Bld 116 (*)    All other components within normal limits  CBC WITH DIFFERENTIAL/PLATELET  URINALYSIS, ROUTINE W REFLEX MICROSCOPIC  TSH    EKG  EKG Interpretation None       Radiology No results found.  Procedures Procedures (including critical care time)  Medications Ordered in ED Medications - No data to display   Initial Impression / Assessment and Plan / ED Course  I have  reviewed the triage vital signs and the nursing notes.  Pertinent labs & imaging results that were available during my care of the patient were reviewed by me and considered in my medical decision making (see chart for details).     Prior to dc, pt also endorses had an endoscopy recently (in [place]) and was told she had yeast in her esophagus).  She is unsure of the treatment she received for this.  Flagyl prescribed for the suspected yeast vaginitis, will extend to a 7 day course to help cover any residual esophageal infection (although no oropharyngeal yeast noted on exam). TSH pending.  Pt advised to f/u with pcp for further management.  No acute findings on today exam.  Referrals given.   The patient appears reasonably screened and/or stabilized for discharge and I doubt any other medical condition or other [facility] requiring further screening, evaluation, or treatment in the ED at this time prior to  discharge.   Final Clinical Impressions(s) / ED Diagnoses   Final diagnoses:  Acute non-recurrent sinusitis, unspecified location  Unexplained weight gain  Hypothyroidism, unspecified type  Yeast vaginitis    New Prescriptions New Prescriptions   FLUCONAZOLE (DIFLUCAN) 200 MG TABLET    Take 1 tablet (200 mg total) by mouth daily.     Burgess Amor, PA-C 10/19/16 [facility]    Eber Hong, MD 10/27/16 [phone]

## 2016-10-19 NOTE — ED Triage Notes (Signed)
Pt comes in with nasal congestion, cough, and sore throat that started one week ago. Pt was seen at PCP's office Tuesday and told she had a URI. She was placed on antibiotics but doesn't feel like she's getting any better.

## 2016-10-24 ENCOUNTER — Ambulatory Visit (INDEPENDENT_AMBULATORY_CARE_PROVIDER_SITE_OTHER): Payer: Medicare Other | Admitting: Family Medicine

## 2016-10-24 ENCOUNTER — Encounter: Payer: Self-pay | Admitting: Family Medicine

## 2016-10-24 VITALS — BP 126/82 | HR 78 | Temp 97.1°F | Resp 16 | Ht 65.0 in | Wt 173.0 lb

## 2016-10-24 DIAGNOSIS — R5383 Other fatigue: Secondary | ICD-10-CM

## 2016-10-24 DIAGNOSIS — R911 Solitary pulmonary nodule: Secondary | ICD-10-CM

## 2016-10-24 DIAGNOSIS — R739 Hyperglycemia, unspecified: Secondary | ICD-10-CM | POA: Diagnosis not present

## 2016-10-24 DIAGNOSIS — N281 Cyst of kidney, acquired: Secondary | ICD-10-CM

## 2016-10-24 DIAGNOSIS — R410 Disorientation, unspecified: Secondary | ICD-10-CM

## 2016-10-24 DIAGNOSIS — O159 Eclampsia, unspecified as to time period: Secondary | ICD-10-CM

## 2016-10-24 LAB — POCT URINALYSIS DIPSTICK
Bilirubin, UA: NEGATIVE
GLUCOSE UA: NEGATIVE
Ketones, UA: NEGATIVE
NITRITE UA: NEGATIVE
PROTEIN UA: NEGATIVE
RBC UA: NEGATIVE
UROBILINOGEN UA: 0.2 U/dL
pH, UA: 5 (ref 5.0–8.0)

## 2016-10-24 NOTE — Progress Notes (Signed)
Patient ID: Denise Mcguire, female    DOB: 28-May-1950, 66 y.o.   MRN: [id number]  Chief Complaint  Patient presents with  . Ear Pain  . Bloated  . Weight Gain  . Anorexia  . Diarrhea  . Lab work    Patient had labs at [facility] on Thursday   Current Outpatient Prescriptions on File Prior to Visit  Medication Sig Dispense Refill  . ALPRAZolam (XANAX) 1 MG tablet Take 1 tablet by mouth 3 (three) times daily as needed for anxiety.    . fluconazole (DIFLUCAN) 200 MG tablet Take 1 tablet (200 mg total) by mouth daily. 7 tablet 0  . HYDROcodone-acetaminophen (NORCO/VICODIN) 5-325 MG tablet Take 1 tablet by mouth 3 (three) times daily as needed for moderate pain.    . pantoprazole (PROTONIX) 40 MG tablet Take 1 tablet by mouth daily.    . propranolol (INDERAL) 10 MG tablet Take 1 tablet by mouth 2 (two) times daily.    . ranitidine (ZANTAC) 150 MG tablet Take 1 tablet by mouth daily.    Marland Kitchen SYNTHROID 75 MCG tablet Take 1 tablet by mouth daily.     No current facility-administered medications on file prior to visit.      Allergies Amitriptyline and Prednisone  Subjective:   Denise Mcguire is a 66 y.o. female who presents to [facility] today.  HPI Reports that has not felt well for three weeks. Reports that voice has been rasp and has had a sinus infection. Was put on augmentin for sinuses. Was then seen again and given phenergan DM syrup for sinus issues.  Reports that was seen in the ED and then was given diflucan too. Reports that she had an endoscopy in May and was told that she had yeast in her esophagus. Reports that has never been told she was immune compromised.   Has a lot of medicine listed on her chart but reports that she is not taking all of them. Uses the vicodin prn for pain due to bone spurs. Takes the xanax for anxiety at times. Has been using the phenergan cough syrup and was given the hydroxyzine recently for sweating/itching a lot when she would work out at  [employer].   Her and husband reports that feels like mind is not clear. Feels unstable on feet. Vision is ok. Does not feel good. Ears feel full at times. . Feels bloated and appetite is down. Urinating well. No abdominal pain. Has had some occasional diarrhea but only at times. Is not in any pain. Fell several weeks ago but did not hit head per report.  Does not eat very much but seems to be gaining weight.  Husband expresses dissatisfaction with the ED b/c he reports that he took her there b/c something is just not right and they did not do anything. She reports that at times she feels confused but he does not believe she is confused but she is not her normal self. Reports that she usually is very active and busy but over the past several weeks she has just been sitting, resting, and watching tv. Reports that she does not feel down, sad, or depressed. Has been married a long time and feels happy in relationship.  They do not have records from visits to doctor or the GI doctor.   Reports that had a seizure right after the birth of her child but never had any problems. Mood is good. Energy is low. Vision normal.  Reports  had to describe but does not feel like her normal self. Used to be full of energy and not does not feel that way. Feels that head is not clear and fuzzy at times. Hearing is normal. Feels off balance at times but the room is not spinning. Hearing is good.     Past Medical History:  Diagnosis Date  . Cyst, kidney, acquired   . Diverticulosis   . Gallstone   . Gallstones   . Hiatal hernia   . Hiatal hernia   . Hypertension   . Thyroid disease     Past Surgical History:  Procedure Laterality Date  . ABDOMINAL HYSTERECTOMY    . TUBAL LIGATION      Family History  Problem Relation Age of Onset  . Heart disease Mother   . Alcohol abuse Father   . Diabetes Sister      Social History   Social History  . Marital status: Married    Spouse name: N/A  . Number of  children: N/A  . Years of education: N/A   Social History Main Topics  . Smoking status: Never Smoker  . Smokeless tobacco: Never Used  . Alcohol use Yes     Comment: occasional   . Drug use: No  . Sexual activity: Yes   Other Topics Concern  . None   Social History Narrative  . None    Review of Systems  Constitutional: Positive for appetite change, fatigue and unexpected weight change. Negative for chills, diaphoresis and fever.  HENT: Negative for congestion, dental problem, ear pain, hearing loss, nosebleeds, postnasal drip, rhinorrhea, sinus pain, sneezing, sore throat, trouble swallowing and voice change.   Respiratory: Negative for cough, choking, chest tightness, shortness of breath and wheezing.   Cardiovascular: Negative for chest pain, palpitations and leg swelling.  Gastrointestinal: Positive for diarrhea. Negative for abdominal pain, blood in stool, constipation, nausea, rectal pain and vomiting.       Reports that at times can have loose stools. But not now. Can go days without diarrhea and then may have it. Reports that feels bloated a lot but does not eat very much. Does not understand why she is gaining weight or not loosing it when she does not eat much and eats healthy.  Genitourinary: Negative for decreased urine volume, difficulty urinating, dysuria, flank pain, frequency, hematuria, pelvic pain and urgency.  Musculoskeletal: Negative for arthralgias, back pain, myalgias, neck pain and neck stiffness.  Skin: Negative for rash.  Neurological: Negative for tremors, seizures, syncope, speech difficulty and weakness.  Hematological: Negative for adenopathy.  Psychiatric/Behavioral: Negative for agitation, decreased concentration and dysphoric mood. The patient is not nervous/anxious.      Objective:   BP 126/82 (BP Location: Left Arm, Patient Position: Sitting, Cuff Size: Normal)   Pulse 78   Resp 16   Physical Exam  Constitutional: She is oriented to person,  place, and time. Vital signs are normal. She appears well-developed and well-nourished. No distress.  HENT:  Head: Normocephalic and atraumatic.  Right Ear: Tympanic membrane, external ear and ear canal normal.  Left Ear: Tympanic membrane, external ear and ear canal normal.  Nose: Nose normal.  Mouth/Throat: Uvula is midline, oropharynx is clear and moist and mucous membranes are normal. No oral lesions. No dental caries. No oropharyngeal exudate, posterior oropharyngeal edema, posterior oropharyngeal erythema or tonsillar abscesses. No tonsillar exudate.  Prominent torus in mouth  Eyes: Pupils are equal, round, and reactive to light. Conjunctivae and EOM are normal. No  scleral icterus.  Neck: Normal range of motion. Neck supple. No JVD present. No tracheal deviation present. No thyromegaly present.  Cardiovascular: Normal rate, regular rhythm, normal heart sounds and intact distal pulses.   Pulmonary/Chest: Effort normal and breath sounds normal. No respiratory distress.  Raspy voice  Abdominal: Soft. Bowel sounds are normal. She exhibits no distension. There is no tenderness. There is no rebound and no guarding. No hernia.  Musculoskeletal: Normal range of motion. She exhibits no edema.  Lymphadenopathy:    She has no cervical adenopathy.  Neurological: She is alert and oriented to person, place, and time. No cranial nerve deficit or sensory deficit. Coordination normal.  Skin: Skin is warm and dry. No rash noted. No erythema.  Psychiatric: Her behavior is normal. Judgment and thought content normal. Her mood appears anxious. Her speech is not slurred. She is not agitated, not aggressive and not hyperactive. She exhibits normal remote memory.  Patient seemed to get a little confused with answering some questions that required specific details. Memory was intact.      Assessment and Plan   1. Confusion/Decrease in Mental Clarity Spent a long time discussing with patient and her husband  Letitia Libra) that her intermittent confusion or not being herself could be due to her multiple medications she is taking. Discussed that at her age phenergan, vicodin, xanax, hydroxyzine.Marland Kitchenalone or combined can cause side effects and mental/sensorium changes, confusion, risk of falls, and problems. We discussed that I have just met her and do not know her previous level of function or state of health. I discussed that we could obtain head Ct or could stop all possible offending medications and see if she feels more mental clarity. They wish to proceed with CT scan at this time. They will stop all the medications we discussed.   Counseled if mental status changes or worsens or develops any problems to go to the ED. Voiced understanding.  - CT Head Wo Contrast; Future  2. Fatigue, unspecified type Discussed could be due to medications. Check labs.  - CBC with Differential/Platelet - Basic metabolic panel - POCT urinalysis dipstick  3. Hyperglycemia Review of chart reveals elevated sugars over time. Check labs.  - Hemoglobin A1c    No Follow-up on file. Caren Macadam, MD 10/24/2016

## 2016-10-24 NOTE — Progress Notes (Deleted)
    Patient ID: Denise Mcguire, female    DOB: [birth year], 66 y.o.   MRN: [id number]  No chief complaint on file.   Allergies Amitriptyline and Prednisone  Subjective:   Denise Mcguire is a 66 y.o. female who presents to [facility] today.  HPI HPI  Past Medical History:  Diagnosis Date  . Diverticulosis   . Gallstones   . Hiatal hernia   . Hypertension   . Thyroid disease     Past Surgical History:  Procedure Laterality Date  . ABDOMINAL HYSTERECTOMY    . TUBAL LIGATION      No family history on file.   Social History   Social History  . Marital status: Married    Spouse name: N/A  . Number of children: N/A  . Years of education: N/A   Social History Main Topics  . Smoking status: Never Smoker  . Smokeless tobacco: Never Used  . Alcohol use Yes     Comment: occasional   . Drug use: No  . Sexual activity: Not on file   Other Topics Concern  . Not on file   Social History Narrative  . No narrative on file    Review of Systems   Objective:   There were no vitals taken for this visit.  Physical Exam   Assessment and Plan   There are no diagnoses linked to this encounter.   No Follow-up on file. Mack Hook, LPN 1/47/8295

## 2016-10-25 ENCOUNTER — Telehealth: Payer: Self-pay | Admitting: General Practice

## 2016-10-25 LAB — CBC WITH DIFFERENTIAL/PLATELET
Basophils Absolute: 80 cells/uL (ref 0–200)
Basophils Relative: 0.8 %
EOS PCT: 1.8 %
Eosinophils Absolute: 180 cells/uL (ref 15–500)
HEMATOCRIT: 41.8 % (ref 35.0–45.0)
HEMOGLOBIN: 14.2 g/dL (ref 11.7–15.5)
LYMPHS ABS: 1960 {cells}/uL (ref 850–3900)
MCH: 31.5 pg (ref 27.0–33.0)
MCHC: 34 g/dL (ref 32.0–36.0)
MCV: 92.7 fL (ref 80.0–100.0)
MONOS PCT: 7.8 %
MPV: 9.8 fL (ref 7.5–12.5)
NEUTROS ABS: 7000 {cells}/uL (ref 1500–7800)
Neutrophils Relative %: 70 %
Platelets: 310 10*3/uL (ref 140–400)
RBC: 4.51 10*6/uL (ref 3.80–5.10)
RDW: 13.1 % (ref 11.0–15.0)
Total Lymphocyte: 19.6 %
WBC mixed population: 780 cells/uL (ref 200–950)
WBC: 10 10*3/uL (ref 3.8–10.8)

## 2016-10-25 LAB — BASIC METABOLIC PANEL
BUN/Creatinine Ratio: 10 (calc) (ref 6–22)
BUN: 10 mg/dL (ref 7–25)
CHLORIDE: 103 mmol/L (ref 98–110)
CO2: 28 mmol/L (ref 20–32)
CREATININE: 1 mg/dL — AB (ref 0.50–0.99)
Calcium: 9.7 mg/dL (ref 8.6–10.4)
GLUCOSE: 104 mg/dL (ref 65–139)
Potassium: 4.5 mmol/L (ref 3.5–5.3)
Sodium: 139 mmol/L (ref 135–146)

## 2016-10-25 LAB — HEMOGLOBIN A1C
EAG (MMOL/L): 6.3 (calc)
Hgb A1c MFr Bld: 5.6 % of total Hgb (ref <5.7)
MEAN PLASMA GLUCOSE: 114 (calc)

## 2016-10-25 NOTE — Telephone Encounter (Signed)
-----   Message from Aliene Beams, MD sent at 10/25/2016  8:28 AM EDT ----- Please call and advise that labs look good. She is not diabetic, electrolytes, kidney tests, and blood counts, and urine are acceptable. I am waiting to get the CT scan results back. Please let us know how she is doing. Janine Limbo. Tracie Harrier, MD

## 2016-10-25 NOTE — Telephone Encounter (Signed)
Patient informed of message below, verbalized understanding. She states she is doing okay today.

## 2016-11-01 ENCOUNTER — Ambulatory Visit (HOSPITAL_COMMUNITY)
Admission: RE | Admit: 2016-11-01 | Discharge: 2016-11-01 | Disposition: A | Discharge status: Home / Self Care | Payer: Medicare Other | Source: Ambulatory Visit | Attending: Family Medicine | Admitting: Family Medicine

## 2016-11-01 DIAGNOSIS — R410 Disorientation, unspecified: Secondary | ICD-10-CM | POA: Diagnosis not present

## 2016-11-02 ENCOUNTER — Ambulatory Visit (HOSPITAL_COMMUNITY): Payer: Medicare Other

## 2016-11-06 ENCOUNTER — Telehealth: Payer: Self-pay | Admitting: Family Medicine

## 2016-11-06 NOTE — Telephone Encounter (Signed)
Patient message on voice mail. Calling to get the results of the CT Scan on Oct 3rd.

## 2016-11-06 NOTE — Telephone Encounter (Signed)
Requesting a call to go over CT scan results  Cb#: [phone]

## 2016-11-06 NOTE — Telephone Encounter (Signed)
Please advise that the head CT was within normal limits. No abnormality. Please keep follow up. Denise Mcguire. Tracie Harrier, MD

## 2016-11-07 ENCOUNTER — Telehealth: Payer: Self-pay | Admitting: Family Medicine

## 2016-11-07 DIAGNOSIS — F419 Anxiety disorder, unspecified: Secondary | ICD-10-CM

## 2016-11-07 NOTE — Telephone Encounter (Signed)
Patient left message on voice mail @ 9:57 returning you call re the CT Scan (results)    cb [phone]

## 2016-11-07 NOTE — Telephone Encounter (Signed)
Called patient regarding message below. No answer, left generic message for patient to return call.   

## 2016-11-07 NOTE — Telephone Encounter (Signed)
Called and spoke to Corneshia, states she takes the xanax 2 to 3 times a day depending on her stress level. Is asking for you to refill. Is aware this is something you usually do not rx. Is asking to be referred to someone if you will not rx.

## 2016-11-07 NOTE — Telephone Encounter (Signed)
Has no pcp listed?

## 2016-11-07 NOTE — Telephone Encounter (Signed)
Patient calling again in ref to the CT Scan and the results .  She is aware that Dr. Nelson is not in the office until Tuesday. Patient is requesting Xanax . .  She only has enough to get her thru will the end of the month.    [facility]  ([address])

## 2016-11-08 NOTE — Telephone Encounter (Signed)
Patient informed of message below, verbalized understanding.  

## 2016-11-08 NOTE — Telephone Encounter (Signed)
Please call and advise patient that I do not recommend that she continue this medicine. I believe that this medicine was contributing to some of the symptoms that she presented with at her office visit. Please advised that this medication increases her fall risk, can make her sleepy and sedated, and is habit forming. I will do a referral to behavior health/psychiatry, but I do not recommend she continue this medication. If she is interested in another medication for anxiety management or weaning off of this medication, please ask her to follow-up in our office. Please advised that the referral for basic behavioral health has been placed.Janine Limbo. Tracie Harrier, MD

## 2016-11-09 ENCOUNTER — Telehealth: Payer: Self-pay | Admitting: Family Medicine

## 2016-11-09 ENCOUNTER — Telehealth (HOSPITAL_COMMUNITY): Payer: Self-pay

## 2016-11-09 NOTE — Telephone Encounter (Signed)
Patient called in because she left her medical records from old PCP with Dr.Hagler (she states she brought them in & she did not keep a copy for herself) she says she had a [place] of her kidneys & lungs that needed to be repeated (6 mths after initial imaging). Please call her to discuss. Cb# [phone]
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
## 2016-12-12 ENCOUNTER — Ambulatory Visit (INDEPENDENT_AMBULATORY_CARE_PROVIDER_SITE_OTHER): Payer: Medicare Other | Admitting: Psychiatry

## 2016-12-12 ENCOUNTER — Encounter (HOSPITAL_COMMUNITY): Payer: Self-pay | Admitting: Psychiatry

## 2016-12-12 VITALS — BP 132/84 | HR 76 | Ht 65.0 in | Wt 172.0 lb

## 2016-12-12 DIAGNOSIS — Z9849 Cataract extraction status, unspecified eye: Secondary | ICD-10-CM

## 2016-12-12 DIAGNOSIS — Z6379 Other stressful life events affecting family and household: Secondary | ICD-10-CM

## 2016-12-12 DIAGNOSIS — R450 Nervousness: Secondary | ICD-10-CM | POA: Diagnosis not present

## 2016-12-12 DIAGNOSIS — F419 Anxiety disorder, unspecified: Secondary | ICD-10-CM

## 2016-12-12 DIAGNOSIS — Z62810 Personal history of physical and sexual abuse in childhood: Secondary | ICD-10-CM | POA: Diagnosis not present

## 2016-12-12 DIAGNOSIS — R4582 Worries: Secondary | ICD-10-CM

## 2016-12-12 DIAGNOSIS — G4700 Insomnia, unspecified: Secondary | ICD-10-CM | POA: Diagnosis not present

## 2016-12-12 DIAGNOSIS — F410 Panic disorder [episodic paroxysmal anxiety] without agoraphobia: Secondary | ICD-10-CM

## 2016-12-12 DIAGNOSIS — R4583 Excessive crying of child, adolescent or adult: Secondary | ICD-10-CM | POA: Diagnosis not present

## 2016-12-12 DIAGNOSIS — Z811 Family history of alcohol abuse and dependence: Secondary | ICD-10-CM

## 2016-12-12 DIAGNOSIS — Z818 Family history of other mental and behavioral disorders: Secondary | ICD-10-CM

## 2016-12-12 MED ORDER — LORAZEPAM 0.5 MG PO TABS: 0.5000 mg | ORAL_TABLET | Freq: Three times a day (TID) | ORAL | 0 refills | Status: DC | PRN

## 2016-12-12 MED ORDER — MIRTAZAPINE 15 MG PO TABS: ORAL_TABLET | ORAL | 0 refills | Status: DC

## 2016-12-12 NOTE — Patient Instructions (Signed)
1. Start mirtazapine 7.5 mg at night for one week, then 15 mg at night 2. Discontinue Xanax 3. Start lorazepam 0.5 mg three times a day as needed for anxiety  4. Return to clinic in one month for 30 mins

## 2016-12-13 ENCOUNTER — Ambulatory Visit: Payer: Medicare Other | Admitting: Family Medicine

## 2016-12-18 ENCOUNTER — Telehealth (HOSPITAL_COMMUNITY): Payer: Self-pay | Admitting: *Deleted

## 2016-12-18 ENCOUNTER — Telehealth (HOSPITAL_COMMUNITY): Payer: Self-pay | Admitting: Psychiatry

## 2016-12-18 NOTE — Telephone Encounter (Signed)
Discussed with patient and her husband on speaker phone with verbal consent of patient. Spent more than 30 mins on this. She states that she has tried mirtazpaine, ativan and both caused palpitation. She denies any palpitation when she is not on these medication. She denies tremors to concern for withdrawal symptoms. She states that she used to be doing much better while on Xanax. She stopped doing exercise these days. She would like to be back on Xanax. Her husband also requests for her to be back on Xanax. Discussed risk of confusion and fall secondary to Xanax. After having discussion in length including the option of trying buspar or other SSRI/SNRI for treatment for anxiety, she is not interested in this option. She is advised to taper off ativan 0.5 mg per day (She has been taking lorazepam 0.5 mg TID for about a week). She will cancel the appointment and is planning to find a provider in the area.  - Discontinue mirtazapine - Decrease ativan 0.5 mg BID for one day, then 0.5 mg for one day then discontinue

## 2016-12-18 NOTE — Telephone Encounter (Signed)
Received a call from NP,  [place]. The provider would like to know about the evaluation in this clinic. She is advised to obtain consent form for release of information from the patient for further discussion. Fax number is given.

## 2016-12-18 NOTE — Telephone Encounter (Signed)
Dr Vanetta ShawlHisada patient called in stating that the anxiety  medication is causing increased heart rate  and that she has a family history of heart problems including her mother. Also that she rather have xanxax over   ativan due to increase headaches & elevated BP. [phone] requested a phone call from doctor.

## 2016-12-26 ENCOUNTER — Telehealth (HOSPITAL_COMMUNITY): Payer: Self-pay | Admitting: *Deleted

## 2016-12-26 NOTE — Telephone Encounter (Signed)
I believe she is not taking any medication from me anymore. Please check in with the patient and advice to contact with her primary care as indicated. Also please check with her if she wants to keep an appointment on 12/13; although it was cancelled it once after discussion (pt wanted to see other practitioner for behavioral health), it is rescheduled again per chart.

## 2016-12-26 NOTE — Telephone Encounter (Signed)
Followed up with patient to get more clarity on which medication she was referring to. Remeron she wonders if she should be on the medication with her dx: of heart failure.   Asked if she was seeing cardiologist which she replied yes she has a appointment on Thursday 12/28/16. I informed her that they would be the best one to ask about the medication  & the effects on the heart.  I asked for a call back after appointment on Thursday 12/28/16.

## 2016-12-26 NOTE — Telephone Encounter (Signed)
voice message from patient, said she has an enlarged heart and need to speak with someone regarding her medications.

## 2016-12-26 NOTE — Telephone Encounter (Signed)
Noted. I believe that we have discussed that she would discontinue mirtazapine as it caused her palpitation, although I would not recommend against continuing it if she does not have any side effect. Also please discuss if she would like to keep her appointment; the last discussion was that she would find another provider as this note writer would not prescribe Xanax.

## 2017-01-03 ENCOUNTER — Encounter: Payer: Self-pay | Admitting: Family Medicine

## 2017-01-11 ENCOUNTER — Ambulatory Visit (HOSPITAL_COMMUNITY): Payer: Medicare Other | Admitting: Psychiatry

## 2017-01-11 ENCOUNTER — Ambulatory Visit (HOSPITAL_COMMUNITY): Payer: Self-pay | Admitting: Psychiatry

## 2017-02-04 ENCOUNTER — Encounter: Payer: Self-pay | Admitting: Family Medicine

## 2017-02-12 ENCOUNTER — Emergency Department (HOSPITAL_COMMUNITY)
Admission: EM | Admit: 2017-02-12 | Discharge: 2017-02-12 | Disposition: A | Discharge status: Home / Self Care | Payer: Medicare Other | Attending: Emergency Medicine | Admitting: Emergency Medicine

## 2017-02-12 ENCOUNTER — Emergency Department (HOSPITAL_COMMUNITY): Payer: Medicare Other

## 2017-02-12 ENCOUNTER — Other Ambulatory Visit: Payer: Self-pay

## 2017-02-12 ENCOUNTER — Encounter (HOSPITAL_COMMUNITY): Payer: Self-pay | Admitting: Emergency Medicine

## 2017-02-12 DIAGNOSIS — Z79899 Other long term (current) drug therapy: Secondary | ICD-10-CM | POA: Diagnosis not present

## 2017-02-12 DIAGNOSIS — F419 Anxiety disorder, unspecified: Secondary | ICD-10-CM | POA: Insufficient documentation

## 2017-02-12 DIAGNOSIS — N3000 Acute cystitis without hematuria: Secondary | ICD-10-CM | POA: Insufficient documentation

## 2017-02-12 DIAGNOSIS — E039 Hypothyroidism, unspecified: Secondary | ICD-10-CM | POA: Insufficient documentation

## 2017-02-12 DIAGNOSIS — I10 Essential (primary) hypertension: Secondary | ICD-10-CM | POA: Insufficient documentation

## 2017-02-12 LAB — CBC WITH DIFFERENTIAL/PLATELET
Basophils Absolute: 0 10*3/uL (ref 0.0–0.1)
Basophils Relative: 0 %
EOS ABS: 0.1 10*3/uL (ref 0.0–0.7)
EOS PCT: 1 %
HCT: 41 % (ref 36.0–46.0)
Hemoglobin: 13.5 g/dL (ref 12.0–15.0)
LYMPHS ABS: 1.4 10*3/uL (ref 0.7–4.0)
LYMPHS PCT: 15 %
MCH: 31.4 pg (ref 26.0–34.0)
MCHC: 32.9 g/dL (ref 30.0–36.0)
MCV: 95.3 fL (ref 78.0–100.0)
MONO ABS: 0.6 10*3/uL (ref 0.1–1.0)
Monocytes Relative: 6 %
Neutro Abs: 7.2 10*3/uL (ref 1.7–7.7)
Neutrophils Relative %: 78 %
PLATELETS: 314 10*3/uL (ref 150–400)
RBC: 4.3 MIL/uL (ref 3.87–5.11)
RDW: 13.3 % (ref 11.5–15.5)
WBC: 9.3 10*3/uL (ref 4.0–10.5)

## 2017-02-12 LAB — BASIC METABOLIC PANEL
Anion gap: 10 (ref 5–15)
BUN: 12 mg/dL (ref 6–20)
CALCIUM: 9.6 mg/dL (ref 8.9–10.3)
CHLORIDE: 106 mmol/L (ref 101–111)
CO2: 25 mmol/L (ref 22–32)
CREATININE: 1 mg/dL (ref 0.44–1.00)
GFR calc Af Amer: >60 mL/min (ref >60)
GFR calc non Af Amer: 57 mL/min — ABNORMAL LOW (ref >60)
Glucose, Bld: 118 mg/dL — ABNORMAL HIGH (ref 65–99)
Potassium: 4.7 mmol/L (ref 3.5–5.1)
Sodium: 141 mmol/L (ref 135–145)

## 2017-02-12 LAB — URINALYSIS, ROUTINE W REFLEX MICROSCOPIC
Bilirubin Urine: NEGATIVE
Glucose, UA: NEGATIVE mg/dL
Hgb urine dipstick: NEGATIVE
Ketones, ur: NEGATIVE mg/dL
Nitrite: POSITIVE — AB
PH: 5 (ref 5.0–8.0)
Protein, ur: NEGATIVE mg/dL
SPECIFIC GRAVITY, URINE: 1.013 (ref 1.005–1.030)

## 2017-02-12 LAB — TROPONIN I: Troponin I: <0.03 ng/mL (ref <0.03)

## 2017-02-12 MED ORDER — CEPHALEXIN 500 MG PO CAPS: 500.0000 mg | ORAL_CAPSULE | Freq: Two times a day (BID) | ORAL | 0 refills | Status: DC

## 2017-02-12 MED ORDER — CEPHALEXIN 500 MG PO CAPS
500.0000 mg | ORAL_CAPSULE | Freq: Once | ORAL | Status: AC
  Administered 2017-02-12: 500 mg via ORAL
  Filled 2017-02-12: qty 1

## 2017-02-12 NOTE — Discharge Instructions (Signed)
Follow up with your primary care doctor as we discussed.  Consider seeing a mental health doctor.  Take the antibiotics as prescribed for the urinary tract infection

## 2017-02-12 NOTE — ED Provider Notes (Signed)
[facility] EMERGENCY DEPARTMENT Provider Note   CSN: [id number] Arrival date & time: 02/12/17  1254     History   Chief Complaint Chief Complaint  Patient presents with  . Palpitations  . Dizziness    HPI Denise Mcguire is a 67 y.o. female.  HPI Pt saw her doctor recently and was started on zoloft to treat anxiety, depression.   Pt states her sx have persisted.  She does not have much of an appetite.  She feels emotional.  Today she was feeling poorly and felt bad.  Her heart was racing.  This comes and goes and she is not feeling her heart rate now.  She thinks she feels worse  Since being on the medication.  She tried to call her doctor about the new medication and whether she could change it but did not hear from them.  Her sx have been ongoing for a while and her husband is frustrated.  He says he has taken her to see several different doctors and her sx persist.  Past Medical History:  Diagnosis Date  . Cyst, kidney, acquired   . Diverticulosis   . Gallstone   . Gallstones   . Hiatal hernia   . Hiatal hernia   . Hypertension   . Thyroid disease     Patient Active Problem List   Diagnosis Date Noted  . Anxiety disorder 12/12/2016  . Renal cyst 10/24/2016  . Hyperglycemia 10/24/2016  . Eclampsia 10/24/2016  . Pulmonary nodule 10/24/2016    Past Surgical History:  Procedure Laterality Date  . ABDOMINAL HYSTERECTOMY    . TUBAL LIGATION      OB History    No data available       Home Medications    Prior to Admission medications   Medication Sig Start Date End Date Taking? Authorizing Provider  amoxicillin-clavulanate (AUGMENTIN) 875-125 MG tablet TAKE 1 TABLET BY MOUTH EVERY 12 HOURS FOR 10 DAYS 10/17/16   [provider]  cephALEXin (KEFLEX) 500 MG capsule Take 1 capsule (500 mg total) by mouth 2 (two) times daily. 02/12/17   Linwood Dibbles, MD  HYDROcodone-acetaminophen (NORCO/VICODIN) 5-325 MG tablet Take 1 tablet by mouth 3 (three) times daily as  needed for moderate pain.    [provider]  hydrOXYzine (ATARAX/VISTARIL) 25 MG tablet Take 25 mg by mouth every 8 (eight) hours as needed. 10/17/16   [provider]  LORazepam (ATIVAN) 0.5 MG tablet Take 1 tablet (0.5 mg total) 3 (three) times daily as needed by mouth for anxiety. 12/12/16   Neysa Hotter, MD  mirtazapine (REMERON) 15 MG tablet Start 7.5 mg at night for one week, then 15 mg at night 12/12/16   Hisada, Barbee Cough, MD  pantoprazole (PROTONIX) 40 MG tablet Take 1 tablet by mouth daily. 05/12/16   [provider]  promethazine-dextromethorphan (PROMETHAZINE-DM) 6.25-15 MG/5ML syrup TAKE BY MOUTH EVERY 6 HOURS FOR 10 DAYS 10/17/16   [provider]  propranolol (INDERAL) 10 MG tablet Take 1 tablet by mouth 2 (two) times daily. 05/12/16   [provider]  ranitidine (ZANTAC) 150 MG tablet Take 1 tablet by mouth daily. 05/29/16   [provider]  SYNTHROID 75 MCG tablet Take 1 tablet by mouth daily. 05/12/16   [provider]    Family History Family History  Problem Relation Age of Onset  . Heart disease Mother   . Alcohol abuse Father   . Diabetes Sister   . Bipolar disorder Son  Social History Social History   Tobacco Use  . Smoking status: Never Smoker  . Smokeless tobacco: Never Used  Substance Use Topics  . Alcohol use: Yes    Comment: occasional   . Drug use: No     Allergies   Amitriptyline and Prednisone   Review of Systems Review of Systems  Constitutional: Negative for fever.  Respiratory: Negative for chest tightness and shortness of breath.   Cardiovascular: Negative for chest pain.  Gastrointestinal: Negative for abdominal pain.  Genitourinary: Positive for frequency.  Musculoskeletal: Negative for back pain.  Neurological: Negative for tremors, speech difficulty, numbness and headaches.  Psychiatric/Behavioral: The patient is nervous/anxious.   All other systems reviewed and are  negative.    Physical Exam Updated Vital Signs BP 119/85   Pulse 81   Temp 98.3 F (36.8 C) (Oral)   Resp 19   SpO2 93%   Physical Exam  Constitutional: She appears well-developed and well-nourished. No distress.  HENT:  Head: Normocephalic and atraumatic.  Right Ear: External ear normal.  Left Ear: External ear normal.  Eyes: Conjunctivae are normal. Right eye exhibits no discharge. Left eye exhibits no discharge. No scleral icterus.  Neck: Neck supple. No tracheal deviation present.  Cardiovascular: Normal rate, regular rhythm and intact distal pulses.  Pulmonary/Chest: Effort normal and breath sounds normal. No stridor. No respiratory distress. She has no wheezes. She has no rales.  Abdominal: Soft. Bowel sounds are normal. She exhibits no distension. There is no tenderness. There is no rebound and no guarding.  Musculoskeletal: She exhibits no edema or tenderness.  Neurological: She is alert. She has normal strength. No cranial nerve deficit (no facial droop, extraocular movements intact, no slurred speech) or sensory deficit. She exhibits normal muscle tone. She displays no seizure activity. Coordination normal.  Skin: Skin is warm and dry. No rash noted.  Psychiatric: She has a normal mood and affect.  Nursing note and vitals reviewed.    ED Treatments / Results  Labs (all labs ordered are listed, but only abnormal results are displayed) Labs Reviewed  BASIC METABOLIC PANEL - Abnormal; Notable for the following components:      Result Value   Glucose, Bld 118 (*)    GFR calc non Af Amer 57 (*)    All other components within normal limits  URINALYSIS, ROUTINE W REFLEX MICROSCOPIC - Abnormal; Notable for the following components:   APPearance HAZY (*)    Nitrite POSITIVE (*)    Leukocytes, UA LARGE (*)    Bacteria, UA FEW (*)    Squamous Epithelial / LPF 0-5 (*)    All other components within normal limits  TROPONIN I  CBC WITH DIFFERENTIAL/PLATELET    EKG   EKG Interpretation  Date/Time:  Monday February 12 2017 13:17:05 EST Ventricular Rate:  94 PR Interval:  138 QRS Duration: 86 QT Interval:  358 QTC Calculation: 447 R Axis:   42 Text Interpretation:  Normal sinus rhythm Normal ECG No old tracing to compare Confirmed by Linwood Dibbles [phone]) on 02/12/2017 5:48:22 PM       Radiology Dg Chest 2 View  Result Date: 02/12/2017 CLINICAL DATA:  Patient with palpitations. Dizziness. Productive cough. EXAM: CHEST  2 VIEW COMPARISON:  None. FINDINGS: Normal cardiac and mediastinal contours. Bibasilar heterogeneous opacities. No pleural effusion or pneumothorax. Thoracic spine degenerative changes. IMPRESSION: Bibasilar atelectasis.  No acute cardiopulmonary process. Electronically Signed   By: Annia Belt M.D.   On: 02/12/2017 13:35    Procedures  Procedures (including critical care time)  Medications Ordered in ED Medications  cephALEXin (KEFLEX) capsule 500 mg (not administered)     Initial Impression / Assessment and Plan / ED Course  I have reviewed the triage vital signs and the nursing notes.  Pertinent labs & imaging results that were available during my care of the patient were reviewed by me and considered in my medical decision making (see chart for details).  Clinical Course as of Feb 12 1898  Mon Feb 12, 2017  1733 Troponin I: <0.03 [JK]    Clinical Course User Index [JK] Linwood DibblesKnapp, Sharron Simpson, MD    Patient presented to the emergency room with a variety of complaints that have been ongoing for a while.  She has had palpitations, lack of appetite, and anxiety.  Patient has seen a primary care doctor and was recently started on Zoloft.  She feels like her symptoms have gotten worse.  Patient appears to be in no distress.  Her physical exam is normal.  Her vital signs and laboratory tests are reassuring.  She does not appear underweight.  I suspect her symptoms could be related to anxiety.  I encouraged her to follow-up with her primary care  doctor to discuss alternative medications however she may want to continue the Zoloft for now.  Patient also mentions some urinary frequency.  UA does suggest UTI.  Plan on discharge home with oral antibiotics. Final Clinical Impressions(s) / ED Diagnoses   Final diagnoses:  Acute cystitis without hematuria  Anxiety    ED Discharge Orders        Ordered    cephALEXin (KEFLEX) 500 MG capsule  2 times daily     02/12/17 1859       Linwood DibblesKnapp, Subrena Devereux, MD 02/12/17 1900

## 2017-02-12 NOTE — ED Triage Notes (Signed)
Pt started zoloft 4 day ago. Pt started having palpitaion and dizziness yesterday. Last took zoloft today at 1100. Hr to radial is fast but regular. A/o

## 2019-09-26 ENCOUNTER — Other Ambulatory Visit: Payer: Self-pay

## 2019-09-26 ENCOUNTER — Emergency Department (HOSPITAL_COMMUNITY)
Admission: EM | Admit: 2019-09-26 | Discharge: 2019-09-26 | Disposition: A | Discharge status: Home / Self Care | Payer: Medicare Other | Attending: Emergency Medicine | Admitting: Emergency Medicine

## 2019-09-26 ENCOUNTER — Encounter (HOSPITAL_COMMUNITY): Payer: Self-pay

## 2019-09-26 ENCOUNTER — Emergency Department (HOSPITAL_COMMUNITY): Payer: Medicare Other

## 2019-09-26 DIAGNOSIS — Z79899 Other long term (current) drug therapy: Secondary | ICD-10-CM | POA: Insufficient documentation

## 2019-09-26 DIAGNOSIS — I10 Essential (primary) hypertension: Secondary | ICD-10-CM | POA: Insufficient documentation

## 2019-09-26 DIAGNOSIS — N823 Fistula of vagina to large intestine: Secondary | ICD-10-CM

## 2019-09-26 LAB — COMPREHENSIVE METABOLIC PANEL
ALT: 29 U/L (ref 0–44)
AST: 18 U/L (ref 15–41)
Albumin: 4.9 g/dL (ref 3.5–5.0)
Alkaline Phosphatase: 70 U/L (ref 38–126)
Anion gap: 14 (ref 5–15)
BUN: 10 mg/dL (ref 8–23)
CO2: 25 mmol/L (ref 22–32)
Calcium: 9.6 mg/dL (ref 8.9–10.3)
Chloride: 96 mmol/L — ABNORMAL LOW (ref 98–111)
Creatinine, Ser: 0.99 mg/dL (ref 0.44–1.00)
GFR calc Af Amer: >60 mL/min (ref >60)
GFR calc non Af Amer: 59 mL/min — ABNORMAL LOW (ref >60)
Glucose, Bld: 122 mg/dL — ABNORMAL HIGH (ref 70–99)
Potassium: 3 mmol/L — ABNORMAL LOW (ref 3.5–5.1)
Sodium: 135 mmol/L (ref 135–145)
Total Bilirubin: 0.9 mg/dL (ref 0.3–1.2)
Total Protein: 7.8 g/dL (ref 6.5–8.1)

## 2019-09-26 LAB — WET PREP, GENITAL
Clue Cells Wet Prep HPF POC: NONE SEEN
Sperm: NONE SEEN
Trich, Wet Prep: NONE SEEN
Yeast Wet Prep HPF POC: NONE SEEN

## 2019-09-26 LAB — CBC
HCT: 42.4 % (ref 36.0–46.0)
Hemoglobin: 14.5 g/dL (ref 12.0–15.0)
MCH: 31.5 pg (ref 26.0–34.0)
MCHC: 34.2 g/dL (ref 30.0–36.0)
MCV: 92 fL (ref 80.0–100.0)
Platelets: 383 10*3/uL (ref 150–400)
RBC: 4.61 MIL/uL (ref 3.87–5.11)
RDW: 12.3 % (ref 11.5–15.5)
WBC: 10.3 10*3/uL (ref 4.0–10.5)
nRBC: 0 % (ref 0.0–0.2)

## 2019-09-26 LAB — TYPE AND SCREEN
ABO/RH(D): O POS
Antibody Screen: NEGATIVE

## 2019-09-26 MED ORDER — IOHEXOL 300 MG/ML  SOLN
100.0000 mL | Freq: Once | INTRAMUSCULAR | Status: AC | PRN
  Administered 2019-09-26: 100 mL via INTRAVENOUS

## 2019-09-26 MED ORDER — ONDANSETRON HCL 4 MG PO TABS: 4.0000 mg | ORAL_TABLET | Freq: Four times a day (QID) | ORAL | 0 refills | Status: DC

## 2019-09-26 MED ORDER — CIPROFLOXACIN HCL 500 MG PO TABS: 500.0000 mg | ORAL_TABLET | Freq: Two times a day (BID) | ORAL | 0 refills | Status: DC

## 2019-09-26 NOTE — Discharge Instructions (Addendum)
As discussed you will need to see your gynecologist Dr. Carney Corners and you will need a colonoscopy also to further evaluate today's findings and to determine what kind of treatment will resolve this issue.  I suspect you may need surgery but further tests need to be completed first.  Please call your gynecologist and Dr. Allena Katz as listed above for further management.  In the meantime, you have been prescribed some Zofran if needed for nausea or vomiting.  I would encourage you to get rechecked immediately if you develop any fevers or you develop any worsening abdominal pain or increased bleeding episodes.  Additionally you are being placed on an antibiotic to cover you for a possible very mild diverticulitis.  Get these medications filled at your pharmacy in [place].

## 2019-09-26 NOTE — ED Provider Notes (Signed)
[facility] EMERGENCY DEPARTMENT Provider Note   CSN: [id number] Arrival date & time: 09/26/19  1052     History Chief Complaint  Patient presents with  . Vaginal Bleeding  . Rectal Bleeding    Denise Mcguire is a 69 y.o. female with a history of hypertension, thyroid disease, diverticulitis, surgical history significant for cholecystectomy and vaginal hysterectomy presenting for evaluation of lower abdominal pain along with a several day history of intermittent vaginal bleeding in association with rectal bleeding with bowel movements.  She has had no passage of clots, she denies fevers or chills, no nausea or vomiting.  She does endorse a poor appetite in notes an approximate 10 pound weight loss over the past month.  Her symptoms are not similar to prior episode of diverticulitis.  She denies constipation or diarrhea.    HPI     Past Medical History:  Diagnosis Date  . Cyst, kidney, acquired   . Diverticulosis   . Gallstone   . Gallstones   . Hiatal hernia   . Hiatal hernia   . Hypertension   . Thyroid disease     Patient Active Problem List   Diagnosis Date Noted  . Anxiety disorder 12/12/2016  . Renal cyst 10/24/2016  . Hyperglycemia 10/24/2016  . Eclampsia 10/24/2016  . Pulmonary nodule 10/24/2016    Past Surgical History:  Procedure Laterality Date  . ABDOMINAL HYSTERECTOMY    . CHOLECYSTECTOMY    . TUBAL LIGATION       OB History   No obstetric history on file.     Family History  Problem Relation Age of Onset  . Heart disease Mother   . Alcohol abuse Father   . Diabetes Sister   . Bipolar disorder Son     Social History   Tobacco Use  . Smoking status: Never Smoker  . Smokeless tobacco: Never Used  Vaping Use  . Vaping Use: Never used  Substance Use Topics  . Alcohol use: Yes    Comment: occasional   . Drug use: No    Home Medications Prior to Admission medications   Medication Sig Start Date End Date Taking? Authorizing Provider    amoxicillin-clavulanate (AUGMENTIN) 875-125 MG tablet TAKE 1 TABLET BY MOUTH EVERY 12 HOURS FOR 10 DAYS 10/17/16   [provider]  cephALEXin (KEFLEX) 500 MG capsule Take 1 capsule (500 mg total) by mouth 2 (two) times daily. 02/12/17   Linwood Dibbles, MD  HYDROcodone-acetaminophen (NORCO/VICODIN) 5-325 MG tablet Take 1 tablet by mouth 3 (three) times daily as needed for moderate pain.    [provider]  hydrOXYzine (ATARAX/VISTARIL) 25 MG tablet Take 25 mg by mouth every 8 (eight) hours as needed. 10/17/16   [provider]  LORazepam (ATIVAN) 0.5 MG tablet Take 1 tablet (0.5 mg total) 3 (three) times daily as needed by mouth for anxiety. 12/12/16   Neysa Hotter, MD  mirtazapine (REMERON) 15 MG tablet Start 7.5 mg at night for one week, then 15 mg at night 12/12/16   Hisada, Barbee Cough, MD  ondansetron (ZOFRAN) 4 MG tablet Take 1 tablet (4 mg total) by mouth every 6 (six) hours. 09/26/19   Burgess Amor, PA-C  pantoprazole (PROTONIX) 40 MG tablet Take 1 tablet by mouth daily. 05/12/16   [provider]  promethazine-dextromethorphan (PROMETHAZINE-DM) 6.25-15 MG/5ML syrup TAKE BY MOUTH EVERY 6 HOURS FOR 10 DAYS 10/17/16   [provider]  propranolol (INDERAL) 10 MG tablet Take 1 tablet by mouth 2 (two)  times daily. 05/12/16   [provider]  ranitidine (ZANTAC) 150 MG tablet Take 1 tablet by mouth daily. 05/29/16   [provider]  SYNTHROID 75 MCG tablet Take 1 tablet by mouth daily. 05/12/16   [provider]    Allergies    Amitriptyline, Hydroxyzine, Lamotrigine, Prednisone, and Wellbutrin [bupropion]  Review of Systems   Review of Systems  Constitutional: Negative for chills and fever.  HENT: Negative for congestion and sore throat.   Eyes: Negative.   Respiratory: Negative for chest tightness and shortness of breath.   Cardiovascular: Negative for chest pain.  Gastrointestinal: Positive for blood in stool. Negative for  abdominal pain, diarrhea, nausea and vomiting.  Genitourinary: Positive for vaginal bleeding.  Musculoskeletal: Negative for arthralgias, joint swelling and neck pain.  Skin: Negative.  Negative for rash and wound.  Neurological: Negative for dizziness, weakness, light-headedness, numbness and headaches.  Psychiatric/Behavioral: Negative.     Physical Exam Updated Vital Signs BP 136/87   Pulse 92   Temp 98.1 F (36.7 C) (Oral)   Resp 18   Ht 5\' 5"  (1.651 m)   Wt 77.1 kg   SpO2 98%   BMI 28.29 kg/m   Physical Exam Vitals and nursing note reviewed. Exam conducted with a chaperone present.  Constitutional:      Appearance: She is well-developed.  HENT:     Head: Normocephalic and atraumatic.  Eyes:     Conjunctiva/sclera: Conjunctivae normal.  Cardiovascular:     Rate and Rhythm: Normal rate and regular rhythm.     Heart sounds: Normal heart sounds.  Pulmonary:     Effort: Pulmonary effort is normal.     Breath sounds: Normal breath sounds. No wheezing.  Abdominal:     General: Bowel sounds are normal.     Palpations: Abdomen is soft.     Tenderness: There is no abdominal tenderness.  Genitourinary:    Rectum: Normal. Guaiac result negative.     Comments: Traces of dark brown exudate deep within vaginal vault.  No cervix identified.   Hemoccult negative. Musculoskeletal:        General: Normal range of motion.     Cervical back: Normal range of motion.  Skin:    General: Skin is warm and dry.  Neurological:     Mental Status: She is alert.     ED Results / Procedures / Treatments   Labs (all labs ordered are listed, but only abnormal results are displayed) Labs Reviewed  WET PREP, GENITAL - Abnormal; Notable for the following components:      Result Value   WBC, Wet Prep HPF POC MODERATE (*)    All other components within normal limits  COMPREHENSIVE METABOLIC PANEL - Abnormal; Notable for the following components:   Potassium 3.0 (*)    Chloride 96 (*)      Glucose, Bld 122 (*)    GFR calc non Af Amer 59 (*)    All other components within normal limits  CBC  URINALYSIS, ROUTINE W REFLEX MICROSCOPIC  POC OCCULT BLOOD, ED  TYPE AND SCREEN    EKG None  Radiology CT ABDOMEN PELVIS W CONTRAST  Result Date: 09/26/2019 CLINICAL DATA:  Diverticulitis, vaginal bleeding and rectal bleeding that started yesterday. EXAM: CT ABDOMEN AND PELVIS WITH CONTRAST TECHNIQUE: Multidetector CT imaging of the abdomen and pelvis was performed using the standard protocol following bolus administration of intravenous contrast. CONTRAST:  09/28/2019 OMNIPAQUE IOHEXOL 300 MG/ML  SOLN COMPARISON:  Jun 16, 2016  FINDINGS: Lower chest: Mild basilar atelectasis. No consolidation. No pleural effusion. Hepatobiliary: No focal, suspicious hepatic lesion. No biliary duct dilation. Post cholecystectomy with mild distension that is likely post cholecystectomy baseline. Pancreas: Pancreas is normal without ductal dilation or inflammation. Spleen: Spleen normal in size and contour. Adrenals/Urinary Tract: Adrenal glands are normal. Symmetric renal enhancement. RIGHT lower pole renal cyst. No hydronephrosis. Stomach/Bowel: Small hiatal hernia. No acute small bowel process. Appendix is normal. Colonic diverticulosis without overt signs of diverticulitis. The sigmoid colon is is in close proximity to LEFT vaginal apex but without definite fistula and no signs of gas in the vagina. There is suggestion of slight increase in pericolonic vascularity. This is in the region of the sigmoid colon. Vascular/Lymphatic: Calcified and noncalcified atheromatous plaque of the abdominal aorta. No aneurysmal dilation. No adenopathy in the retroperitoneum. No adenopathy in the pelvis. Reproductive: Post hysterectomy with descent of the bladder base in the pelvis. Other: No free air.  No ascites. Musculoskeletal: Spinal degenerative changes. No acute or destructive bone process. IMPRESSION: 1. Colonic diverticulosis  without overt signs of diverticulitis. The sigmoid colon is in close proximity to the LEFT vaginal apex but without definite fistula and no signs of gas in the vagina. On image 64 of series 5 these 2 structures are inseparable but without gas containing tract bridging these 2 structures. Possibility of colo vaginal fistula is entertained based on symptoms and this finding. A follow-up CT with injection of water-soluble contrast or fluoroscopic evaluation after resolution of bleeding may be helpful. Furthermore, direct visualization with endoscopic assessment may be warranted to exclude colonic neoplasm. There are areas of segmental thickening and or peristalsis but no gross mass on CT. 2. Question of low level diverticulitis due to increased vascularity along the LEFT hemicolon. 3. Post cholecystectomy with mild distension that is likely post cholecystectomy baseline. 4. Small hiatal hernia. 5. Aortic atherosclerosis. Aortic Atherosclerosis (ICD10-I70.0). Electronically Signed   By: Donzetta Kohut M.D.   On: 09/26/2019 17:14    Procedures Procedures (including critical care time)  Medications Ordered in ED Medications  iohexol (OMNIPAQUE) 300 MG/ML solution 100 mL (100 mLs Intravenous Contrast Given 09/26/19 1639)    ED Course  I have reviewed the triage vital signs and the nursing notes.  Pertinent labs & imaging results that were available during my care of the patient were reviewed by me and considered in my medical decision making (see chart for details).    MDM Rules/Calculators/A&P                          History, exam and CT imaging most suggestive of a rectovaginal fistula.  Discussed with Dr. Henreitta Leber of general surgery who recommends patient follow-up with both her GI specialist and gynecologist for further evaluation prior to consideration of any surgical intervention.  Patient does have GI in [place], Dr. Allena Katz, her gynecologist is Dr. Carney Corners.   She and her husband understand that she  needs close follow-up with both of these MDs for further diagnostic testing.  She has no significant abdominal pain, but does endorse nausea.  She was prescribed Zofran for this.  Also discussed strict return precautions including fevers, development of pain or any worsened or prolonged episodes of bleeding. Final Clinical Impression(s) / ED Diagnoses Final diagnoses:  Rectovaginal fistula    Rx / DC Orders ED Discharge Orders         Ordered    ondansetron (ZOFRAN) 4 MG tablet  Every  6 hours        09/26/19 1806           Victoriano Laindol, Saskia Simerson, PA-C 09/26/19 1814    Bethann BerkshireZammit, Joseph, MD 09/29/19 1044

## 2019-09-26 NOTE — ED Triage Notes (Signed)
Pt reports vaginal bleeding and rectal bleeding that started yesterday. Pt reports red in toilet water after BM and when she wipes with urination she she red blood. Denies pain. Has a hx of kidney stones

## 2022-04-17 IMAGING — CT CT ABD-PELV W/ CM
2 of 5 series · 15 of 46 positions shown, 17 images · IV contrast (Omnipaque or Isovue)
Comparison: June 16, 2016

CLINICAL DATA: Diverticulitis, vaginal bleeding and rectal bleeding
that started yesterday.

EXAM:
CT ABDOMEN AND PELVIS WITH CONTRAST
TECHNIQUE: Multidetector CT imaging of the abdomen and pelvis was performed
using the standard protocol following bolus administration of
intravenous contrast.
CONTRAST:  100mL OMNIPAQUE IOHEXOL 300 MG/ML  SOLN

[Series 2: axial st · axial · 0.95mm/px · z∈[-626,-216]mm · 12 of 96 slices shown, 14 images]
[im 7/96  soft-tissue]
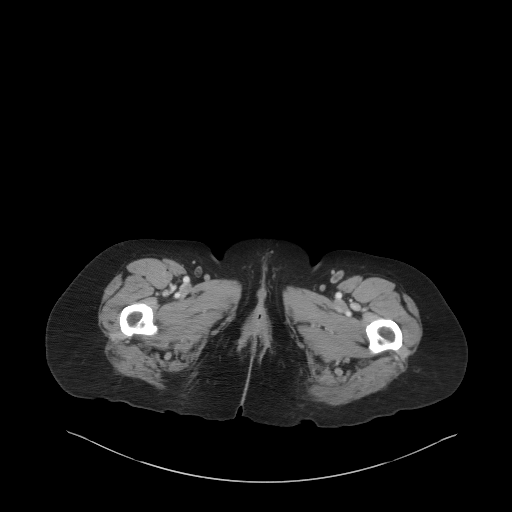
[im 7/96  bone]
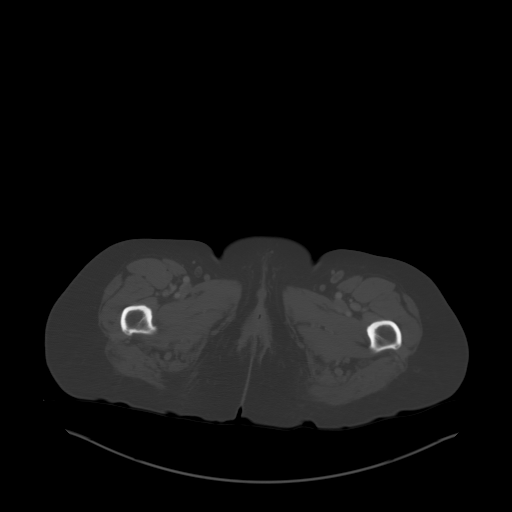
[im 13/96  soft-tissue]
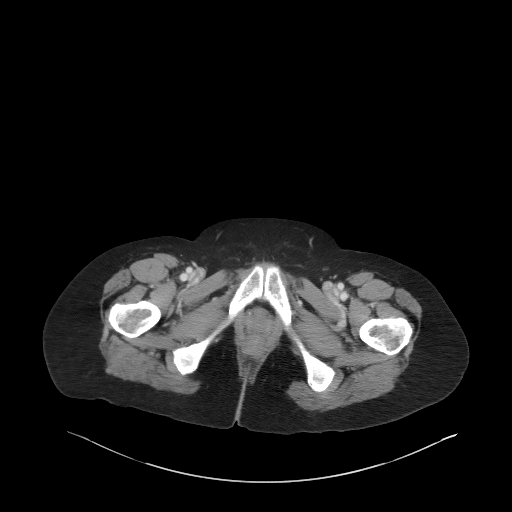
[im 20/96  soft-tissue]
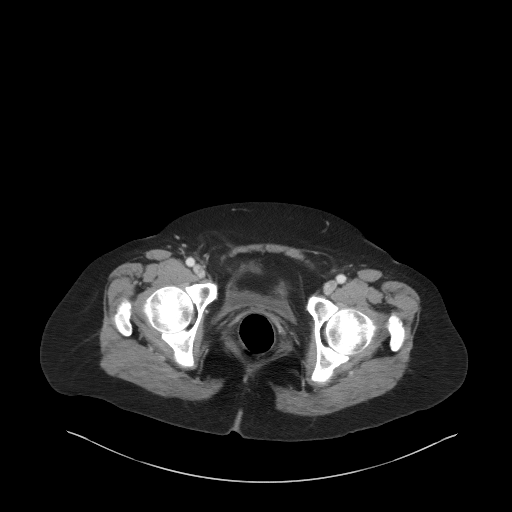
[im 32/96  soft-tissue]
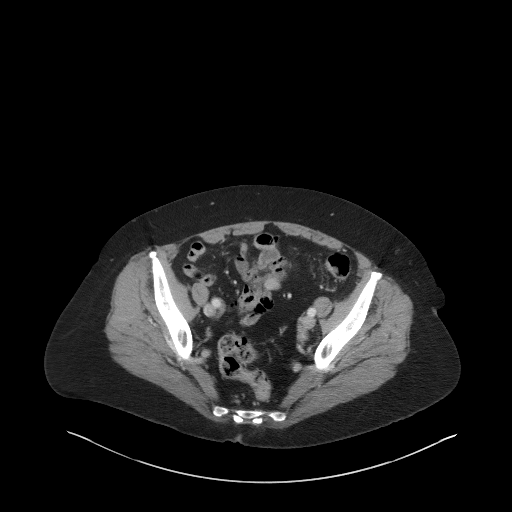
[im 39/96  soft-tissue]
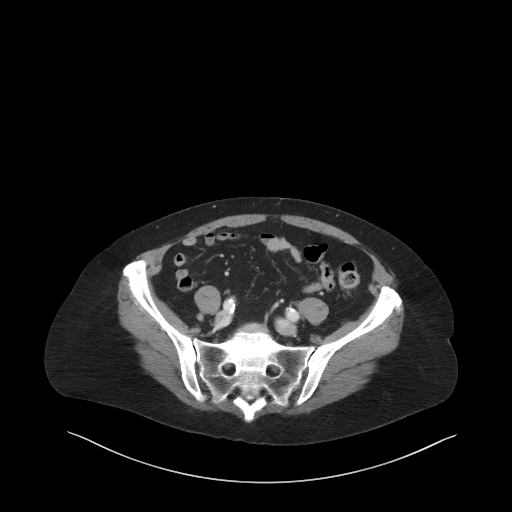
[im 45/96  soft-tissue]
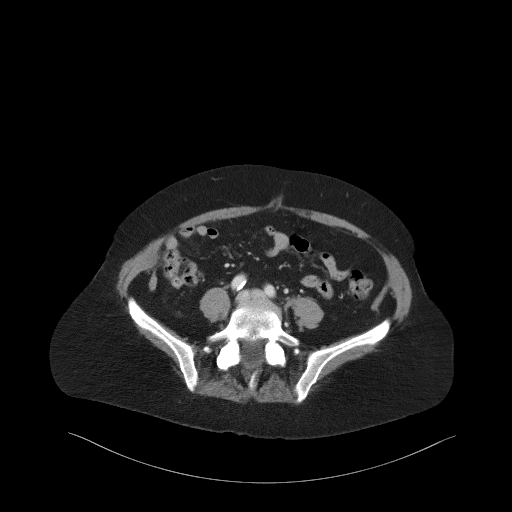
[im 51/96  soft-tissue]
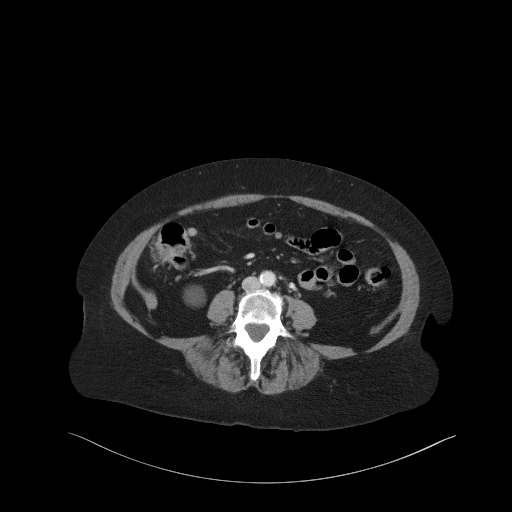
[im 58/96  soft-tissue]
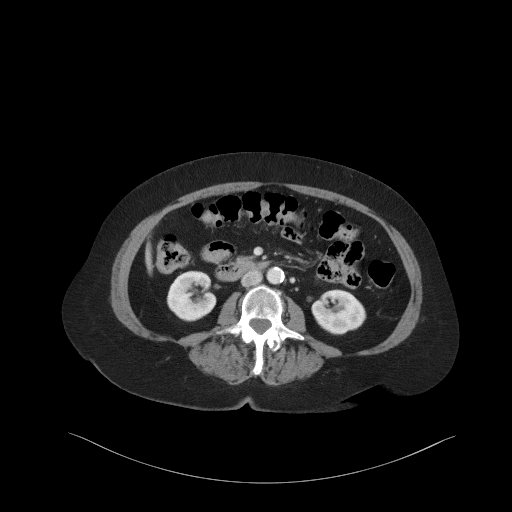
[im 64/96  soft-tissue]
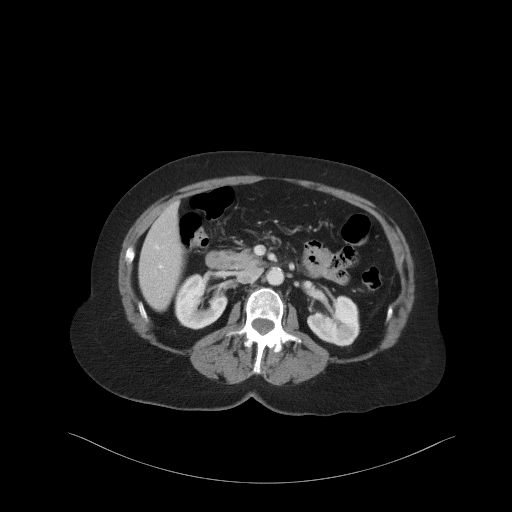
[im 64/96  bone]
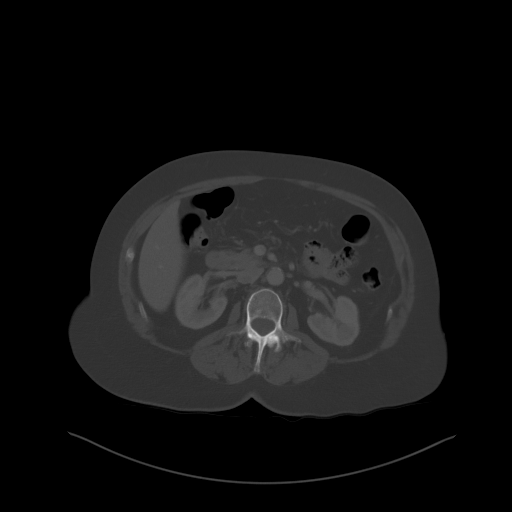
[im 77/96  soft-tissue]
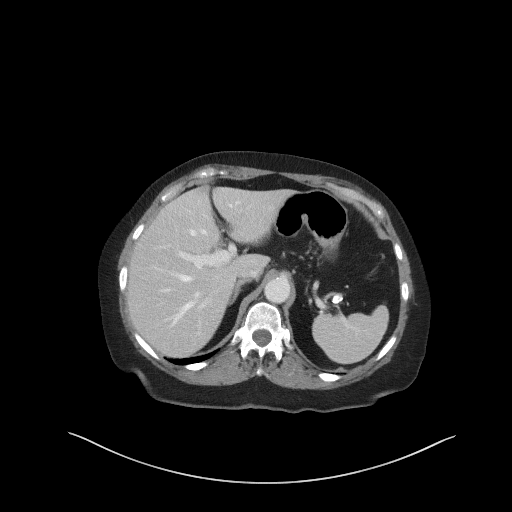
[im 83/96  soft-tissue]
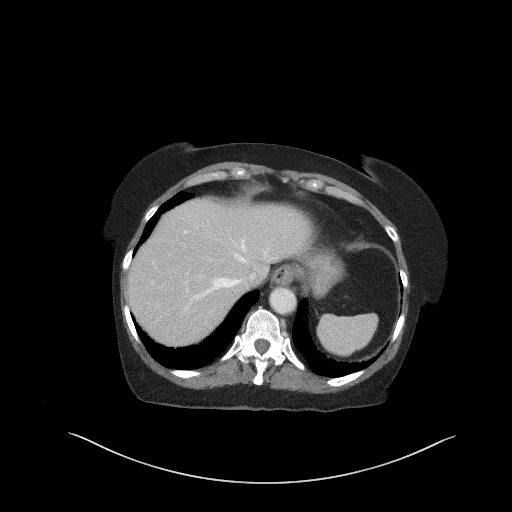
[im 89/96  soft-tissue]
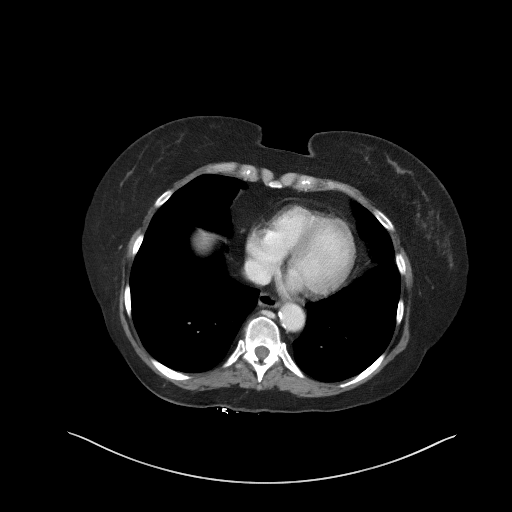

[Series 5: coronal st · coronal · 0.80mm/px · 3 of 104 slices shown]
[im 35/104  soft-tissue]
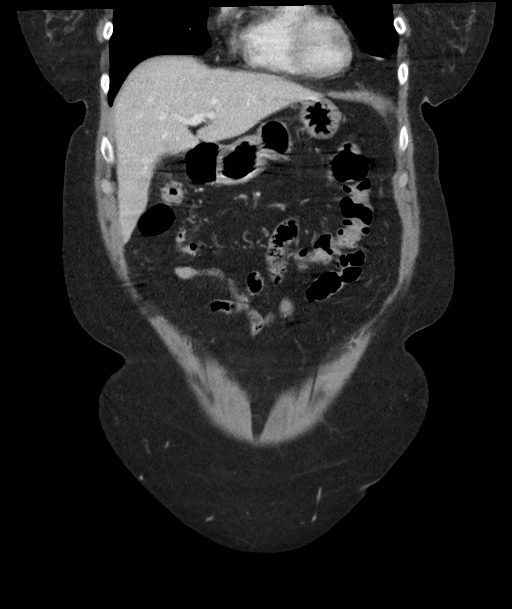
[im 46/104  soft-tissue]
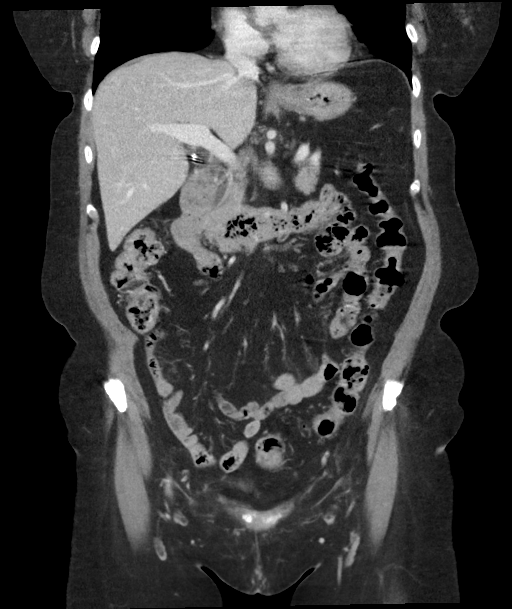
[im 58/104  soft-tissue]
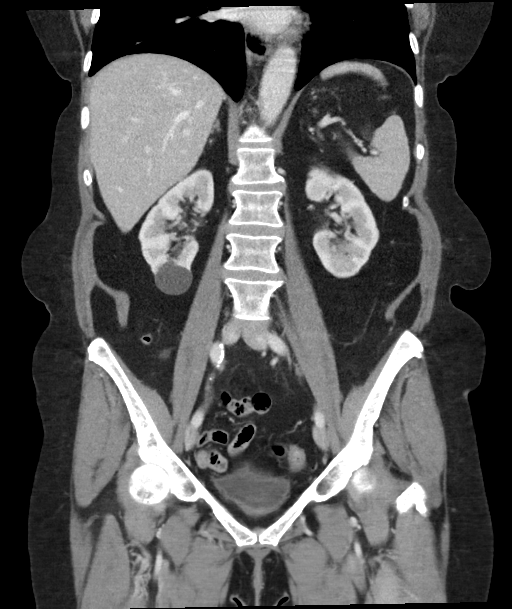

[15 of 46 positions shown; findings below may reference images not displayed]

FINDINGS: Lower chest: Mild basilar atelectasis. No consolidation. No pleural
effusion.

Hepatobiliary: No focal, suspicious hepatic lesion. No biliary duct
dilation. Post cholecystectomy with mild distension that is likely
post cholecystectomy baseline.

Pancreas: Pancreas is normal without ductal dilation or
inflammation.

Spleen: Spleen normal in size and contour.

Adrenals/Urinary Tract: Adrenal glands are normal.

Symmetric renal enhancement. RIGHT lower pole renal cyst. No
hydronephrosis.

Stomach/Bowel: Small hiatal hernia. No acute small bowel process.
Appendix is normal.

Colonic diverticulosis without overt signs of diverticulitis. The
sigmoid colon is is in close proximity to LEFT vaginal apex but
without definite fistula and no signs of gas in the vagina. There is
suggestion of slight increase in pericolonic vascularity. This is in
the region of the sigmoid colon.

Vascular/Lymphatic: Calcified and noncalcified atheromatous plaque
of the abdominal aorta. No aneurysmal dilation. No adenopathy in the
retroperitoneum. No adenopathy in the pelvis.

Reproductive: Post hysterectomy with descent of the bladder base in
the pelvis.

Other: No free air.  No ascites.

Musculoskeletal: Spinal degenerative changes. No acute or
destructive bone process.
IMPRESSION: 1. Colonic diverticulosis without overt signs of diverticulitis. The
sigmoid colon is in close proximity to the LEFT vaginal apex but
without definite fistula and no signs of gas in the vagina. On image
64 of series 5 these 2 structures are inseparable but without gas
containing tract bridging these 2 structures. Possibility of colo
vaginal fistula is entertained based on symptoms and this finding. A
follow-up CT with injection of water-soluble contrast or
fluoroscopic evaluation after resolution of bleeding may be helpful.
Furthermore, direct visualization with endoscopic assessment may be
warranted to exclude colonic neoplasm. There are areas of segmental
thickening and or peristalsis but no gross mass on CT.
2. Question of low level diverticulitis due to increased vascularity
along the LEFT hemicolon.
3. Post cholecystectomy with mild distension that is likely post
cholecystectomy baseline.
4. Small hiatal hernia.
5. Aortic atherosclerosis.

Aortic Atherosclerosis ([id number].C).

## 2024-01-09 ENCOUNTER — Ambulatory Visit: Admitting: Internal Medicine

## 2024-01-09 ENCOUNTER — Ambulatory Visit: Attending: Internal Medicine | Admitting: Internal Medicine

## 2024-01-09 ENCOUNTER — Encounter: Payer: Self-pay | Admitting: Internal Medicine

## 2024-01-09 VITALS — BP 142/98 | HR 83 | Ht 65.0 in | Wt 180.8 lb

## 2024-01-09 DIAGNOSIS — Z136 Encounter for screening for cardiovascular disorders: Secondary | ICD-10-CM | POA: Diagnosis not present

## 2024-01-09 DIAGNOSIS — I2510 Atherosclerotic heart disease of native coronary artery without angina pectoris: Secondary | ICD-10-CM | POA: Insufficient documentation

## 2024-01-09 DIAGNOSIS — E7849 Other hyperlipidemia: Secondary | ICD-10-CM

## 2024-01-09 DIAGNOSIS — E785 Hyperlipidemia, unspecified: Secondary | ICD-10-CM | POA: Insufficient documentation

## 2024-01-09 DIAGNOSIS — I517 Cardiomegaly: Secondary | ICD-10-CM | POA: Diagnosis not present

## 2024-01-09 DIAGNOSIS — I7121 Aneurysm of the ascending aorta, without rupture: Secondary | ICD-10-CM | POA: Diagnosis not present

## 2024-01-09 MED ORDER — ROSUVASTATIN CALCIUM 5 MG PO TABS: 5.0000 mg | ORAL_TABLET | Freq: Every day | ORAL | 3 refills | Status: AC

## 2024-01-09 MED ORDER — LORAZEPAM 0.5 MG PO TABS: 0.5000 mg | ORAL_TABLET | Freq: Two times a day (BID) | ORAL | Status: AC

## 2024-01-09 NOTE — Progress Notes (Signed)
 Cardiology Office Note  Date: 01/09/2024   ID: Denise Mcguire, DOB July 23, 1950, MRN [id number]  PCP:  Darcie Lerner, NP  Cardiologist:  None Electrophysiologist:  None   History of Present Illness: Denise Mcguire is a 73 y.o. female known to have HTN was referred to cardiology clinic for evaluation of cardiomegaly.  I reviewed the records from [place].  Chest x-ray from July 2025 showed cardiomegaly and mild pulmonary edema.  This was performed due to cough.  She did not report any DOE at the time.  She denies having any DOE, orthopnea, PND, leg swelling.  No angina either.  CT chest was also reviewed that showed 4.1 cm ascending aortic aneurysm from September 2025.  Past Medical History:  Diagnosis Date   Cyst, kidney, acquired    Diverticulosis    Gallstone    Gallstones    Hiatal hernia    Hiatal hernia    Hypertension    Thyroid  disease     Past Surgical History:  Procedure Laterality Date   ABDOMINAL HYSTERECTOMY     CHOLECYSTECTOMY     TUBAL LIGATION      Current Outpatient Medications  Medication Sig Dispense Refill   CYMBALTA 30 MG capsule Take 1 capsule by mouth every night  take Cymbalta 60mg  qhs along with Cymbalta 30mg  qhs     CYMBALTA 60 MG capsule Take 60 mg by mouth at bedtime.     gabapentin (NEURONTIN) 300 MG capsule Take 300 mg by mouth 3 (three) times daily.     levocetirizine (XYZAL) 5 MG tablet Take 5 mg by mouth every evening.     levothyroxine (SYNTHROID) 88 MCG tablet Take 88 mcg by mouth daily.     LORazepam  (ATIVAN ) 0.5 MG tablet Take 1 tablet (0.5 mg total) by mouth 2 (two) times daily.     losartan (COZAAR) 50 MG tablet Take 50 mg by mouth daily.     metoprolol tartrate (LOPRESSOR) 25 MG tablet Take 12.5 mg by mouth 2 (two) times daily.     pantoprazole (PROTONIX) 40 MG tablet Take 1 tablet by mouth daily.     pimecrolimus (ELIDEL) 1 % cream Apply 1 Application topically 2 (two) times daily.     QUEtiapine (SEROQUEL) 100 MG tablet Take 100 mg by  mouth at bedtime.     No current facility-administered medications for this visit.   Allergies:  Amitriptyline, Hydroxyzine, Lamotrigine, Prednisone, and Wellbutrin [bupropion]   Social History: The patient  reports that she has never smoked. She has never used smokeless tobacco. She reports current alcohol use. She reports that she does not use drugs.   Family History: The patient's family history includes Alcohol abuse in her father; Bipolar disorder in her son; Diabetes in her sister; Heart disease in her mother.   ROS:  Please see the history of present illness. Otherwise, complete review of systems is positive for none.  All other systems are reviewed and negative.   Physical Exam: VS:  BP (!) 142/98   Pulse 83   Ht 5' 5 (1.651 m)   Wt 180 lb 12.8 oz (82 kg)   SpO2 95%   BMI 30.09 kg/m , BMI Body mass index is 30.09 kg/m.  Wt Readings from Last 3 Encounters:  01/09/24 180 lb 12.8 oz (82 kg)  09/26/19 170 lb (77.1 kg)  10/24/16 173 lb (78.5 kg)    General: Patient appears comfortable at rest. HEENT: Conjunctiva and lids normal, oropharynx clear with moist mucosa. Neck: Supple,  no elevated JVP or carotid bruits, no thyromegaly. Lungs: Clear to auscultation, nonlabored breathing at rest. Cardiac: Regular rate and rhythm, no S3 or significant systolic murmur, no pericardial rub. Abdomen: Soft, nontender, no hepatomegaly, bowel sounds present, no guarding or rebound. Extremities: No pitting edema, distal pulses 2+. Skin: Warm and dry. Musculoskeletal: No kyphosis. Neuropsychiatric: Alert and oriented x3, affect grossly appropriate.  Recent Labwork: No results found for requested labs within last 365 days.  No results found for: CHOL, TRIG, HDL, CHOLHDL, VLDL, LDLCALC, LDLDIRECT   Assessment and Plan:  Ascending aortic aneurysm 4.1 cm - By CT in September 2025 in [place] - Repeat CTA chest/aorta in 1 year - Goal BP less than 130/80 mmHg. - Avoid  fluoroquinolones.  Imaging evidence of cardiomegaly - Chest x-ray from July 25 reviewed, reported cardiomegaly with mild pulmonary edema.  No echo on file.  Obtain echocardiogram.  Imaging evidence of coronary artery calcifications - Does not have angina or DOE.  Not on statin.  LDL 121.  Start rosuvastatin 5 mg at bedtime.  HLD, not at goal - LDL 121.  Start rosuvastatin 5 mg nightly.  Previously tried statins but were discontinued by her PCP due to GI side effects but patient reported that she tolerated statins well.  40-minute spent in reviewing prior medical records, more than 3 labs, discussion and documentation.  Medication Adjustments/Labs and Tests Ordered: Current medicines are reviewed at length with the patient today.  Concerns regarding medicines are outlined above.    Disposition:  Follow up 1 year  Signed, Kenly Henckel Arleta Maywood, MD, 01/09/2024 2:18 PM    [facility] at [facility] [address], [place], [place] [address]

## 2024-01-09 NOTE — Patient Instructions (Addendum)
 Medication Instructions:  Your physician has recommended you make the following change in your medication:   -Start Rosuvastatin 5 mg once daily.   *If you need a refill on your cardiac medications before your next appointment, please call your pharmacy*  Lab Work: BMET- when CTA is scheduled.  If you have labs (blood work) drawn today and your tests are completely normal, you will receive your results only by: MyChart Message (if you have MyChart) OR A paper copy in the mail If you have any lab test that is abnormal or we need to change your treatment, we will call you to review the results.  Testing/Procedures: Non-Cardiac CT Angiography (CTA), is a special type of CT scan that uses a computer to produce multi-dimensional views of major blood vessels throughout the body. In CT angiography, a contrast material is injected through an IV to help visualize the blood vessels  Your physician has requested that you have an echocardiogram. Echocardiography is a painless test that uses sound waves to create images of your heart. It provides your doctor with information about the size and shape of your heart and how well your hearts chambers and valves are working. This procedure takes approximately one hour. There are no restrictions for this procedure. Please do NOT wear cologne, perfume, aftershave, or lotions (deodorant is allowed). Please arrive 15 minutes prior to your appointment time.  Please note: We ask at that you not bring children with you during ultrasound (echo/ vascular) testing. Due to room size and safety concerns, children are not allowed in the ultrasound rooms during exams. Our front office staff cannot provide observation of children in our lobby area while testing is being conducted. An adult accompanying a patient to their appointment will only be allowed in the ultrasound room at the discretion of the ultrasound technician under special circumstances. We apologize for any  inconvenience.   Follow-Up: At [facility], you and your health needs are our priority.  As part of our continuing mission to provide you with exceptional heart care, our providers are all part of one team.  This team includes your primary Cardiologist (physician) and Advanced Practice Providers or APPs (Physician Assistants and Nurse Practitioners) who all work together to provide you with the care you need, when you need it.  Your next appointment:   1 year(s)  Provider:   You may see Vishnu P Mallipeddi, MD or one of the following Advanced Practice Providers on your designated Care Team:   Brittany Strader, PA-C  [place] [place], [place] Olivia Pavy, [place]     We recommend signing up for the patient portal called MyChart.  Sign up information is provided on this After Visit Summary.  MyChart is used to connect with patients for Virtual Visits (Telemedicine).  Patients are able to view lab/test results, encounter notes, upcoming appointments, etc.  Non-urgent messages can be sent to your provider as well.   To learn more about what you can do with MyChart, go to [email or web address].   Other Instructions

## 2024-01-11 ENCOUNTER — Ambulatory Visit: Admitting: Internal Medicine

## 2024-02-13 ENCOUNTER — Ambulatory Visit (HOSPITAL_COMMUNITY): Admission: RE | Admit: 2024-02-13
